# Patient Record
Sex: Female | Born: 1980 | Race: Black or African American | Hispanic: No | Marital: Married | Smoking: Current every day smoker
Health system: Southern US, Community
[De-identification: ages and names within clinical notes are randomized; demographics above are authoritative.]

## PROBLEM LIST (undated history)

## (undated) DIAGNOSIS — L409 Psoriasis, unspecified: Secondary | ICD-10-CM

## (undated) DIAGNOSIS — IMO0002 Reserved for concepts with insufficient information to code with codable children: Secondary | ICD-10-CM

## (undated) DIAGNOSIS — M329 Systemic lupus erythematosus, unspecified: Secondary | ICD-10-CM

## (undated) DIAGNOSIS — M1990 Unspecified osteoarthritis, unspecified site: Secondary | ICD-10-CM

---

## 2009-10-31 ENCOUNTER — Emergency Department (HOSPITAL_COMMUNITY): Admission: EM | Admit: 2009-10-31 | Discharge: 2009-10-31 | Payer: Self-pay | Admitting: Emergency Medicine

## 2011-10-01 ENCOUNTER — Encounter (HOSPITAL_COMMUNITY): Payer: Self-pay | Admitting: *Deleted

## 2011-10-01 ENCOUNTER — Emergency Department (HOSPITAL_COMMUNITY)
Admission: EM | Admit: 2011-10-01 | Discharge: 2011-10-01 | Disposition: A | Discharge status: Home / Self Care | Payer: Self-pay | Attending: Emergency Medicine | Admitting: Emergency Medicine

## 2011-10-01 DIAGNOSIS — R609 Edema, unspecified: Secondary | ICD-10-CM | POA: Insufficient documentation

## 2011-10-01 DIAGNOSIS — Z79899 Other long term (current) drug therapy: Secondary | ICD-10-CM | POA: Insufficient documentation

## 2011-10-01 DIAGNOSIS — M329 Systemic lupus erythematosus, unspecified: Secondary | ICD-10-CM | POA: Insufficient documentation

## 2011-10-01 DIAGNOSIS — M1990 Unspecified osteoarthritis, unspecified site: Secondary | ICD-10-CM

## 2011-10-01 DIAGNOSIS — M129 Arthropathy, unspecified: Secondary | ICD-10-CM | POA: Insufficient documentation

## 2011-10-01 DIAGNOSIS — M7989 Other specified soft tissue disorders: Secondary | ICD-10-CM | POA: Insufficient documentation

## 2011-10-01 HISTORY — DX: Systemic lupus erythematosus, unspecified: M32.9

## 2011-10-01 HISTORY — DX: Psoriasis, unspecified: L40.9

## 2011-10-01 HISTORY — DX: Unspecified osteoarthritis, unspecified site: M19.90

## 2011-10-01 HISTORY — DX: Reserved for concepts with insufficient information to code with codable children: IMO0002

## 2011-10-01 MED ORDER — OXYCODONE-ACETAMINOPHEN 5-325 MG PO TABS: 2.0000 | ORAL_TABLET | ORAL | Status: AC | PRN

## 2011-10-01 MED ORDER — IBUPROFEN 800 MG PO TABS
800.0000 mg | ORAL_TABLET | Freq: Once | ORAL | Status: AC
  Administered 2011-10-01: 800 mg via ORAL
  Filled 2011-10-01: qty 1

## 2011-10-01 NOTE — ED Provider Notes (Signed)
History     CSN: [id number]  Arrival date & time 10/01/11  1305   First MD Initiated Contact with Patient 10/01/11 1350      Chief Complaint  Patient presents with  . Extremity Pain    (Consider location/radiation/quality/duration/timing/severity/associated sxs/prior treatment) Patient is a 31 y.o. female presenting with extremity pain. The history is provided by the patient.  Extremity Pain Pertinent negatives include no headaches.   patient is a 31 year old, female, with lupus, who presents to emergency department complaining of arthralgias.  She denies cough, or shortness of breath, nausea, vomiting, fevers, chills, headaches.  She denies weakness.  She had no other symptoms.  She recently moved from [place], [place].  She does not have a local rheumatologist.  She is not taking any medication for her lupus at this time.  Past Medical History  Diagnosis Date  . Lupus   . rheumatoid   . Psoriasis     History reviewed. No pertinent past surgical history.  No family history on file.  History  Substance Use Topics  . Smoking status: Current Everyday Smoker -- 0.5 packs/day  . Smokeless tobacco: Not on file  . Alcohol Use: Yes     ocassionally    OB History    Grav Para Term Preterm Abortions TAB SAB Ect Mult Living                  Review of Systems  Constitutional: Negative for fever.  Gastrointestinal: Negative for nausea and vomiting.  Musculoskeletal: Positive for joint swelling.  Skin: Positive for rash.  Neurological: Negative for headaches.  Psychiatric/Behavioral: Negative for confusion.  All other systems reviewed and are negative.    Allergies  Review of patient's allergies indicates no known allergies.  Home Medications   Current Outpatient Rx  Name Route Sig Dispense Refill  . ASPIRIN EC 81 MG PO TBEC Oral Take 81 mg by mouth daily.    Marland Kitchen NAPROXEN SODIUM 220 MG PO TABS Oral Take 220 mg by mouth 2 (two) times daily with a meal.    .  OXYCODONE-ACETAMINOPHEN 5-325 MG PO TABS Oral Take 2 tablets by mouth every 4 (four) hours as needed for pain. 15 tablet 0    BP 145/113  Temp(Src) 98.5 F (36.9 C) (Oral)  Resp 16  Wt 145 lb (65.772 kg)  LMP 08/27/2011  Physical Exam  Vitals reviewed. Constitutional: She is oriented to person, place, and time. She appears well-developed and well-nourished.  HENT:  Head: Normocephalic and atraumatic.  Eyes: Pupils are equal, round, and reactive to light.  Neck: Normal range of motion.  Cardiovascular: Normal rate, regular rhythm and normal heart sounds.   No murmur heard. Pulmonary/Chest: Effort normal and breath sounds normal. No respiratory distress. She has no wheezes. She has no rales.  Musculoskeletal: Normal range of motion. She exhibits edema. She exhibits no tenderness.       Bilateral hand joint swelling, with tenderness.  Neurological: She is alert and oriented to person, place, and time. No cranial nerve deficit.  Skin: Skin is warm and dry. No rash noted. No erythema.  Psychiatric: She has a normal mood and affect. Her behavior is normal.    ED Course  Procedures (including critical care time) 31 year old, female, with lupus complains of arthritis, type symptoms.  No systemic symptoms.  There is no indication for radiographs or laboratory testing.  At this time.  Labs Reviewed - No data to display No results found.   1. Arthritis  2. Lupus       MDM  Arthritis, associated with lupus        Nicholes Stairs, MD 10/01/11 [id number]

## 2011-10-01 NOTE — ED Notes (Signed)
Pt states "was dx'd with lupus when I was 14, my ankles, knees, left wrist/hand hurt"

## 2011-10-01 NOTE — ED Notes (Signed)
Pt with L hand swelling from arthritis secondary to Lupus Dx. Pt states she started a new job 3 wks prior at [employer], and that since she has had foot and hand pain

## 2013-10-08 ENCOUNTER — Emergency Department (HOSPITAL_COMMUNITY)
Admission: EM | Admit: 2013-10-08 | Discharge: 2013-10-08 | Disposition: A | Discharge status: Home / Self Care | Payer: No Typology Code available for payment source | Attending: Emergency Medicine | Admitting: Emergency Medicine

## 2013-10-08 ENCOUNTER — Encounter (HOSPITAL_COMMUNITY): Payer: Self-pay | Admitting: Emergency Medicine

## 2013-10-08 DIAGNOSIS — K029 Dental caries, unspecified: Secondary | ICD-10-CM | POA: Insufficient documentation

## 2013-10-08 DIAGNOSIS — K047 Periapical abscess without sinus: Secondary | ICD-10-CM | POA: Insufficient documentation

## 2013-10-08 DIAGNOSIS — F17200 Nicotine dependence, unspecified, uncomplicated: Secondary | ICD-10-CM | POA: Insufficient documentation

## 2013-10-08 DIAGNOSIS — Z7982 Long term (current) use of aspirin: Secondary | ICD-10-CM | POA: Insufficient documentation

## 2013-10-08 DIAGNOSIS — L408 Other psoriasis: Secondary | ICD-10-CM | POA: Insufficient documentation

## 2013-10-08 DIAGNOSIS — K044 Acute apical periodontitis of pulpal origin: Secondary | ICD-10-CM | POA: Insufficient documentation

## 2013-10-08 DIAGNOSIS — Z791 Long term (current) use of non-steroidal anti-inflammatories (NSAID): Secondary | ICD-10-CM | POA: Insufficient documentation

## 2013-10-08 DIAGNOSIS — M129 Arthropathy, unspecified: Secondary | ICD-10-CM | POA: Insufficient documentation

## 2013-10-08 MED ORDER — OXYCODONE-ACETAMINOPHEN 5-325 MG PO TABS: 1.0000 | ORAL_TABLET | ORAL | Status: DC | PRN

## 2013-10-08 MED ORDER — AMOXICILLIN 500 MG PO CAPS
500.0000 mg | ORAL_CAPSULE | Freq: Three times a day (TID) | ORAL | Status: DC
Start: 2013-10-08 — End: 2013-10-08

## 2013-10-08 MED ORDER — ONDANSETRON 4 MG PO TBDP
4.0000 mg | ORAL_TABLET | Freq: Once | ORAL | Status: AC
  Administered 2013-10-08: 4 mg via ORAL
  Filled 2013-10-08: qty 1

## 2013-10-08 MED ORDER — OXYCODONE-ACETAMINOPHEN 5-325 MG PO TABS
2.0000 | ORAL_TABLET | Freq: Once | ORAL | Status: AC
  Administered 2013-10-08: 2 via ORAL
  Filled 2013-10-08: qty 2

## 2013-10-08 MED ORDER — AMOXICILLIN 500 MG PO CAPS
500.0000 mg | ORAL_CAPSULE | Freq: Three times a day (TID) | ORAL | Status: DC
Start: 2013-10-08 — End: 2017-02-03

## 2013-10-08 NOTE — Discharge Instructions (Signed)
You have been diagnosed with Dental pain. Please call the follow up dentist first thing in the morning on Monday for a follow up appointment. Keep your discharge paperwork from today's visit to bring to the dentist office. You may also use the resource guide listed below to help you find a dentist if you do not already have one to followup with. It is very important that you get evaluated by a dentist as soon as possible.  Use your pain medication as prescribed and do not operate heavy machinery while on pain medication. Note that your pain medication contains acetaminophen (Tylenol) & its is not reccommended that you use additional acetaminophen (Tylenol) while taking this medication. Take your full course of antibiotics. Read the instructions below. ° °Eat a soft or liquid diet and rinse your mouth out after meals with warm water. You should see a dentist or return here at once if you have increased swelling, increased pain or uncontrolled bleeding from the site of your injury. ° ° °SEEK MEDICAL CARE IF:  °· You have increased pain not controlled with medicines.  °· You have swelling around your tooth, in your face or neck.  °· You have bleeding which starts, continues, or gets worse.  °· You have a fever >101 °· If you are unable to open your mouth °Soft Diet  °The soft diet may be recommended after you were put on a full liquid diet. A normal diet may follow. The soft diet can also be used after surgery if you are too ill to keep down a normal diet. The soft diet may also be needed if you have a hard time chewing foods.  °DESCRIPTION  °Tender foods are used. Foods do not need to be ground or pureed. Most raw fruits and vegetables and coarse breads and cereals should be avoided. Fried foods and highly seasoned foods may cause discomfort.  °NUTRITIONAL ADEQUACY  °A healthy diet is possible if foods from each of the basic food groups are eaten daily.  °SOFT DIET FOOD LISTS  °Milk/Dairy  °Allowed: Milk and milk  drinks, milk shakes, cream cheese, cottage cheese, mild cheeses.  °Avoid: Sharp or highly seasoned cheese. °Meat/Meat Substitutes  °Allowed: Broiled, roasted, baked, or stewed tender lean beef, mutton, lamb, veal, chicken, turkey, liver, ham, crisp bacon, white fish, tuna, salmon. Eggs, smooth peanut butter.  °Avoid: All fried meats, fish, or fowl. Rich gravies and sauces. Lunch meats, sausages, hot dogs. Meats with gristle, chunky peanut butter. °Breads/Grains  °Allowed: Rice, noodles, spaghetti, macaroni. Dry or cooked refined cereals, such as farina, cream of wheat, oatmeal, grits, whole-wheat cereals. Plain or toasted white or wheat blend or whole-grain breads, soda crackers or saltines, flour tortillas.  °Avoid: Wild rice, coarse cereals, such as bran. Seed in or on breads and crackers. Bread or bread products with nuts or seeds. °Fruits/Vegetables  °Allowed: Fruit and vegetable juices, well-cooked or canned fruits and vegetables, any dried fruit. One citrus fruit daily, 1 vitamin A source daily. Well-ripened, easy to chew fruits, sweet potatoes. Baked, boiled, mashed, creamed, scalloped, or au gratin potatoes. Broths or creamed soups made with allowed vegetables, strained tomatoes.  °Avoid: All gas-forming vegetables (corn, radishes, Brussels sprouts, onions, broccoli, cabbage, parsnips, turnips, chili peppers, pinto beans, split peas, dried beans). Fruits containing seeds and skin. Potato chips and corn chips. All others that are not made with allowed vegetables. Highly seasoned soups. °Desserts/Sweets  °Allowed: Simple desserts, such as custard, junkets, gelatin desserts, plain ice cream and sherbets, simple cakes   and cookies, allowed fruits, sugar, syrup, jelly, honey, plain hard candy, and molasses.  °Avoid: Rich pastries, any dessert containing dates, nuts, raisins, or coconut. Fried pastries, such as doughnuts. Chocolate. °Beverages  °Allowed: Fruit and vegetable juices. Caffeine-free carbonated drinks,  coffee, and tea.  °Avoid: Caffeinated beverages: coffee, tea, soda or pop. °Miscellaneous  °Allowed: Butter, cream, margarine, mayonnaise, oil. Cream sauces, salt, and mild spices.  °Avoid: Highly spiced salad dressings. Highly seasoned foods, hot sauce, mustard, horseradish, and pepper. °SAMPLE MENU  °Breakfast  °Orange juice.  °Oatmeal.  °Soft cooked egg.  °Toast and margarine.  °2% milk.  °Coffee. °Lunch  °Meatloaf.  °Mashed potato.  °Green beans.  °Lemon pudding.  °Bread and margarine.  °Coffee. °Dinner  °Consommé or apricot nectar.  °Chicken breast.  °Rice, peas, and carrots.  °Applesauce.  °Bread and margarine.  °2% milk. °To cut the amount of fat in your diet, omit margarine and use 1% or skim milk.  °NUTRIENT ANALYSIS  °Calories........................1953 Kcal.  °Protein.........................102 gm.  °Carbohydrate...............247 gm.  °Fat................................65 gm.  °Cholesterol...................449 mg.  °Dietary fiber.................19 gm.  °Vitamin A.....................2944 RE.  °Vitamin C.....................79 mg.  °Niacin..........................25 mg.  °Riboflavin....................2.0 mg.  °Thiamin.......................1.5 mg.  °Folate..........................249 mcg.  °Calcium.......................1030 mg.  °Phosphorus.................1782 mg.  °Zinc..............................12 mg.  °Iron..............................13 mg.  °Sodium.........................299 mg.  °Potassium....................3046 mg. °Document Released: 11/26/2007 Document Revised: 11/11/2011 Document Reviewed: 11/26/2007  °ExitCare® Patient Information ©2014 [employer].  ° °RESOURCE GUIDE ° ° °Dental Problems ° °Dr. Janna Civilis °$200 dollar visit °[address] °[place], [place] [address]  °[phone] °  ° °Patients with Medicaid: °[facility]                     [facility] °[address]                                           [address] °Phone:   [phone]                                                  Phone:  [phone] ° °If unable to pay or uninsured, contact:  [facility] or [facility] to become qualified for the adult dental clinic. ° °Chronic Pain Problems °Contact [facility]  [phone] °Patients need to be referred by their primary care doctor. ° °Insufficient Money for Medicine °Contact [employer]:  call "211" or [facility] 271-5999. ° °No Primary Care Doctor °Call [facility]  832-8000 °Other agencies that provide inexpensive medical care °   [facility]  [phone] °    Internal Medicine  [phone] °   [facility]  [phone] °   Women[facility] Clinic  [phone] °   [facility]  [phone] °   [facility]  [phone] ° °Psychological Services °[facility]  [phone] °[facility]  [phone] °[facility]   [phone] (emergency services [phone]) ° °Substance Abuse Resources °[facility]  336-882-2125 °[facility] 336-784-9470 °[facility] 336-285-9073 °[facility] 336-845-3988 °Residential & Outpatient Substance Abuse Program  800-659-3381 ° °Abuse/Neglect °[place] [facility] (336) 641-3795 °[place] [facility] Hot[facility] 800-378-5315 (After Hours) ° °Emergency Shelter °[facility] (336) 271-5985 ° °Maternity Homes °[facility] (336) 275-9566 °[facility] (704) 372-4663 ° °MRSA Hotline #:   832-7006 ° ° ° °[place] [facility] ° °[facility]     [employer]                          [facility] °[address] [place]                       [address]      [address]  °[place]                                                [place]                            [place] °Phone:  [phone]                                   Phone:  [phone]                 Phone:   [phone] ° °[facility] °Phone:  342-8316 ° °[place] [facility] Hotline °(336) 342-1394 °(336) 342-3537 (After Hours) ° ° ° ° ° ° °

## 2013-10-08 NOTE — ED Provider Notes (Signed)
CSN: [id number]     Arrival date & time 10/08/13  1023 History   First MD Initiated Contact with Patient 10/08/13 1030     No chief complaint on file.  (Consider location/radiation/quality/duration/timing/severity/associated sxs/prior Treatment) HPI Comments: Patient here with complaint of dental infection.  She has lupus.  She is not on any suppressants at this time as she has had a lapse in her insurance.  Patient does not take steroids or methotrexate.  The patient states that when she has infection and it makes her lupus flare up which becomes extremely ill and in severe pain.  She is seeking treatment for her infection  Patient is a 33 y.o. female presenting with tooth pain. The history is provided by the patient and medical records. No language interpreter was used.  Dental Pain Location:  Lower Lower teeth location:  31/RL 2nd molar Quality:  Aching, dull and throbbing Severity:  Moderate Onset quality:  Gradual Duration:  2 days Timing:  Constant Progression:  Worsening Chronicity:  New Context: abscess, dental caries and poor dentition   Context: cap still on, not crown fracture, not dental fracture, not enamel fracture, filling still in place, not intrusion, not malocclusion, not recent dental surgery and not trauma   Relieved by:  Nothing Worsened by:  Pressure, jaw movement, hot food/drink, cold food/drink and touching Ineffective treatments:  NSAIDs, ice and topical anesthetic gel Associated symptoms: facial swelling and gum swelling   Associated symptoms: no congestion, no difficulty swallowing, no drooling, no facial pain, no fever, no headaches, no neck pain, no neck swelling, no oral bleeding, no oral lesions and no trismus   Risk factors: lack of dental care     Past Medical History  Diagnosis Date  . Lupus   . rheumatoid   . Psoriasis    No past surgical history on file. No family history on file. History  Substance Use Topics  . Smoking status: Current Every  Day Smoker -- 0.50 packs/day  . Smokeless tobacco: Not on file  . Alcohol Use: Yes     Comment: ocassionally   OB History   Grav Para Term Preterm Abortions TAB SAB Ect Mult Living                 Review of Systems  Constitutional: Negative for fever.  HENT: Positive for facial swelling. Negative for congestion, drooling and mouth sores.   Musculoskeletal: Negative for neck pain.  Neurological: Negative for headaches.    Allergies  Review of patient's allergies indicates no known allergies.  Home Medications   Current Outpatient Rx  Name  Route  Sig  Dispense  Refill  . aspirin EC 81 MG tablet   Oral   Take 81 mg by mouth daily.         . naproxen sodium (ANAPROX) 220 MG tablet   Oral   Take 220 mg by mouth 2 (two) times daily with a meal.          There were no vitals taken for this visit. Physical Exam  Constitutional: She is oriented to person, place, and time. She appears well-developed and well-nourished. No distress.  HENT:  Head: Normocephalic and atraumatic.  Mouth/Throat: Uvula is midline, oropharynx is clear and moist and mucous membranes are normal. No oral lesions. No trismus in the jaw. Dental abscesses and dental caries present. No uvula swelling or lacerations.    Right lower facial swelling.  Second molar is loose.  There is no palpable fluctuance in the  calm.  Suspect periapical abscess developing.  Eyes: Conjunctivae are normal. No scleral icterus.  Neck: Normal range of motion.  Cardiovascular: Normal rate, regular rhythm and normal heart sounds.  Exam reveals no gallop and no friction rub.   No murmur heard. Pulmonary/Chest: Effort normal and breath sounds normal. No respiratory distress.  Abdominal: Soft. Bowel sounds are normal. She exhibits no distension and no mass. There is no tenderness. There is no guarding.  Neurological: She is alert and oriented to person, place, and time.  Skin: Skin is warm and dry. Rash (psoriasis) noted. She is  not diaphoretic.    ED Course  Procedures (including critical care time) Labs Review Labs Reviewed - No data to display Imaging Review No results found.  EKG Interpretation   None       MDM   1. Dental infection    Patient with toothache.  No gross abscess.  Exam unconcerning for Ludwig's angina or spread of infection.  Will treat with penicillin and pain medicine.  Urged patient to follow-up with dentist.     Arthor Captain, PA-C 10/08/13 1050

## 2013-10-08 NOTE — ED Notes (Signed)
Pt c/o R side lower dental pain and possible infection.  Pain score 7/10.  Hx of Lupus (nonmedicated).

## 2013-10-08 NOTE — ED Provider Notes (Signed)
Medical screening examination/treatment/procedure(s) were performed by non-physician practitioner and as supervising physician I was immediately available for consultation/collaboration.     Geoffery Lyonsouglas Coralynn Gaona, MD 10/08/13 [phone]

## 2013-10-08 NOTE — ED Notes (Signed)
Pt escorted to discharge window. Verbalized understanding discharge instructions. In no acute distress.  Pt has been educated to not drive or operate heavy machinery while taking pain medication.  Pt was given dental resources and verbalized understanding.

## 2014-09-13 ENCOUNTER — Emergency Department (HOSPITAL_COMMUNITY): Payer: No Typology Code available for payment source

## 2014-09-13 ENCOUNTER — Emergency Department (HOSPITAL_COMMUNITY)
Admission: EM | Admit: 2014-09-13 | Discharge: 2014-09-13 | Disposition: A | Discharge status: Home / Self Care | Payer: No Typology Code available for payment source | Attending: Emergency Medicine | Admitting: Emergency Medicine

## 2014-09-13 ENCOUNTER — Encounter (HOSPITAL_COMMUNITY): Payer: Self-pay | Admitting: Emergency Medicine

## 2014-09-13 DIAGNOSIS — Y9289 Other specified places as the place of occurrence of the external cause: Secondary | ICD-10-CM | POA: Insufficient documentation

## 2014-09-13 DIAGNOSIS — S62502A Fracture of unspecified phalanx of left thumb, initial encounter for closed fracture: Secondary | ICD-10-CM

## 2014-09-13 DIAGNOSIS — Y998 Other external cause status: Secondary | ICD-10-CM | POA: Insufficient documentation

## 2014-09-13 DIAGNOSIS — Z792 Long term (current) use of antibiotics: Secondary | ICD-10-CM | POA: Insufficient documentation

## 2014-09-13 DIAGNOSIS — Y9389 Activity, other specified: Secondary | ICD-10-CM | POA: Insufficient documentation

## 2014-09-13 DIAGNOSIS — Z720 Tobacco use: Secondary | ICD-10-CM | POA: Insufficient documentation

## 2014-09-13 DIAGNOSIS — W228XXA Striking against or struck by other objects, initial encounter: Secondary | ICD-10-CM | POA: Insufficient documentation

## 2014-09-13 DIAGNOSIS — M79645 Pain in left finger(s): Secondary | ICD-10-CM

## 2014-09-13 DIAGNOSIS — S62515A Nondisplaced fracture of proximal phalanx of left thumb, initial encounter for closed fracture: Secondary | ICD-10-CM | POA: Insufficient documentation

## 2014-09-13 DIAGNOSIS — Z872 Personal history of diseases of the skin and subcutaneous tissue: Secondary | ICD-10-CM | POA: Insufficient documentation

## 2014-09-13 DIAGNOSIS — M1990 Unspecified osteoarthritis, unspecified site: Secondary | ICD-10-CM | POA: Insufficient documentation

## 2014-09-13 MED ORDER — OXYCODONE-ACETAMINOPHEN 5-325 MG PO TABS
2.0000 | ORAL_TABLET | Freq: Once | ORAL | Status: AC
  Administered 2014-09-13: 2 via ORAL
  Filled 2014-09-13: qty 2

## 2014-09-13 MED ORDER — OXYCODONE-ACETAMINOPHEN 5-325 MG PO TABS: 2.0000 | ORAL_TABLET | Freq: Four times a day (QID) | ORAL | Status: DC | PRN

## 2014-09-13 MED ORDER — PREDNISONE 20 MG PO TABS: 40.0000 mg | ORAL_TABLET | Freq: Every day | ORAL | Status: DC

## 2014-09-13 NOTE — Discharge Instructions (Signed)
Thumb Fracture  °There are many types of thumb fractures (breaks). There are different ways of treating these fractures, all of which may be correct, varying from case to case. Your caregiver will discuss different ways to treat these fractures with you. °TREATMENT  °· Immobilization. This means the fracture is casted as it is without changing the positions of the fracture (bone pieces) involved. This fracture is casted in a "thumb spica" also called a hitchhiker cast. It is generally left on for 2 to 6 weeks. °· Closed reduction. The bones are manipulated back into position without using surgery. °· ORIF (open reduction and internal fixation). The fracture site is opened and the bone pieces are fixed into place with some type of hardware such as screws or wires. °Your caregiver will discuss the type of fracture you have and the treatment that will be best for that problem. If surgery is the treatment of choice, the following is information for you to know and to let your caregiver know about prior to surgery. °LET YOUR CAREGIVERS KNOW ABOUT: °· Allergies. °· Medications taken including herbs, eye drops, over the counter medications, and creams. °· Use of steroids (by mouth or creams). °· Previous problems with anesthetics or Novocain. °· Family history of anesthetic complications.. °· Possibility of pregnancy, if this applies. °· History of blood clots (thrombophlebitis). °· History of bleeding or blood problems. °· Previous surgery. °· Other health problems. °AFTER THE PROCEDURE  °After surgery, you will be taken to the recovery area. A nurse will watch and check your progress. Once you are awake, stable, and taking fluids well, barring other problems you will be allowed to go home. Once home, an ice pack applied to your operative site may help with discomfort and keep the swelling down. Elevate your hand above your heart as much as possible for the first 4-5 days after the injury/surgery. °HOME CARE INSTRUCTIONS    °· Follow your caregiver's instructions as to activities, exercises, physical therapy, and driving a car. °· Use thumb and exercise as directed. °· Only take over-the-counter or prescription medicines for pain, discomfort, or fever as directed by your caregiver. Do not take aspirin until your caregiver instructs. This can increase bleeding immediately following surgery. °SEEK MEDICAL CARE IF:  °· There is increased bleeding (more than a small spot) from the wound or from beneath your cast or splint. °· There is redness, swelling, or increasing pain in the wound or from beneath your cast or splint. °· You have pus coming from wound or from beneath your cast or splint. °· An unexplained oral temperature above 102° F (38.9° C) develops. °· There is a foul smell coming from the wound or dressing or from beneath your cast or splint. °SEEK IMMEDIATE MEDICAL CARE IF:  °· You develop severe pain, decreased sensation such as numbness or tingling. °· You develop a rash. °· You have difficulty breathing. °· Youhave any allergic problems. °If you do not have a window in your cast for observing the wound, a discharge or minor bleeding may show up as a stain on the outside of your cast. Report these findings to your caregiver. If you have a removable splint overlying the surgical dressings it is common to see a small amount of bleeding. Change the dressings as instructed by your caregiver. °Document Released: 05/18/2003 Document Revised: 11/11/2011 Document Reviewed: 10/22/2013 °ExitCare® Patient Information ©2015 [employer]. This information is not intended to replace advice given to you by your health care provider. Make sure   you discuss any questions you have with your health care provider. ° °

## 2014-09-13 NOTE — ED Notes (Signed)
Ortho tech at bedside 

## 2014-09-13 NOTE — ED Notes (Signed)
Pt escorted to discharge window. Pt verbalized understanding discharge instructions. In no acute distress.  

## 2014-09-13 NOTE — ED Notes (Signed)
Pt states she has psoriais and has a patch on her left hand that is not getting better, she hit it yesterday and pain got worse.

## 2014-09-13 NOTE — ED Provider Notes (Signed)
CSN: [id number]     Arrival date & time 09/13/14  0751 History   First MD Initiated Contact with Patient 09/13/14 [phone]     Chief Complaint  Patient presents with  . Hand Pain     (Consider location/radiation/quality/duration/timing/severity/associated sxs/prior Treatment) HPI Comments: Patient with past medical history of lupus, rheumatoid arthritis, and psoriasis, who presents emergency department with chief complaint of left thumb pain. She states that she injured her left thumb when it was struck by a bolt of fabric yesterday. She complains of increased pain with movement. She also complains of psoriasis breaking out on the palmar aspect of her left hand and thumb. She states that this aggravates her symptoms. She states that she has tried using several different creams with no relief. She has taken prednisone in the past with good relief. She has not taken anything for the pain. Pain is worsened with movement and palpation.  The history is provided by the patient. No language interpreter was used.    Past Medical History  Diagnosis Date  . Lupus   . rheumatoid   . Psoriasis    History reviewed. No pertinent past surgical history. No family history on file. History  Substance Use Topics  . Smoking status: Current Every Day Smoker -- 0.50 packs/day    Types: Cigarettes  . Smokeless tobacco: Not on file  . Alcohol Use: Yes     Comment: ocassionally   OB History    No data available     Review of Systems  Constitutional: Negative for fever and chills.  Respiratory: Negative for shortness of breath.   Cardiovascular: Negative for chest pain.  Gastrointestinal: Negative for nausea, vomiting, diarrhea and constipation.  Genitourinary: Negative for dysuria.  Musculoskeletal: Positive for arthralgias.  Skin: Positive for rash.      Allergies  Review of patient's allergies indicates no known allergies.  Home Medications   Prior to Admission medications   Medication Sig  Start Date End Date Taking? Authorizing Provider  amoxicillin (AMOXIL) 500 MG capsule Take 1 capsule (500 mg total) by mouth 3 (three) times daily. 10/08/13   Arthor Captain, PA-C  naproxen sodium (ANAPROX) 220 MG tablet Take 220 mg by mouth 2 (two) times daily as needed (pain).     Historical Provider, MD  oxyCODONE-acetaminophen (PERCOCET) 5-325 MG per tablet Take 1-2 tablets by mouth every 4 (four) hours as needed. 10/08/13   Arthor Captain, PA-C   BP 133/97 mmHg  Pulse 89  Temp(Src) 98.7 F (37.1 C) (Oral)  Resp 16  SpO2 98%  LMP 08/29/2014 Physical Exam  Constitutional: She is oriented to person, place, and time. She appears well-developed and well-nourished.  HENT:  Head: Normocephalic and atraumatic.  Eyes: Conjunctivae and EOM are normal.  Neck: Normal range of motion.  Cardiovascular: Normal rate.   Intact distal pulses with brisk capillary refill  Pulmonary/Chest: Effort normal.  Abdominal: She exhibits no distension.  Musculoskeletal: Normal range of motion.  Left thumb range of motion strength limited secondary to pain, moderate swelling about the DIP and MCP, no obvious bony abnormality or deformity  Neurological: She is alert and oriented to person, place, and time.  Skin: Skin is dry.  Patchy psoriasis noted on face, neck, arm, and hand  Psychiatric: She has a normal mood and affect. Her behavior is normal. Judgment and thought content normal.  Nursing note and vitals reviewed.   ED Course  Procedures (including critical care time) Labs Review Labs Reviewed - No data to display  Imaging Review Dg Finger Thumb Left  09/13/2014   CLINICAL DATA:  The patient jammed her left thumb 09/12/2014. Continued pain. Initial encounter.  EXAM: LEFT THUMB 2+V  COMPARISON:  None.  FINDINGS: There is a nondisplaced fracture through the head head of the proximal phalanx of the thumb with associated soft tissue swelling. The fracture appears incomplete. No other bony or joint  abnormality is identified.  IMPRESSION: Nondisplaced fracture head of the proximal phalanx of the left thumb.   Electronically Signed   By: Drusilla Kannerhomas  Dalessio M.D.   On: 09/13/2014 08:53    Images reviewed in the PACS system by me personally, I agree with radiologist's impression.  SPLINT APPLICATION Date/Time: 9:33 AM Authorized by: Roxy HorsemanBROWNING, Timberly Yott Consent: Verbal consent obtained. Risks and benefits: risks, benefits and alternatives were discussed Consent given by: patient Splint applied by: orthopedic technician Location details: left thumb Splint type: thumb spica Supplies used: fiberglass and ace and padding Post-procedure: The splinted body part was neurovascularly unchanged following the procedure. Patient tolerance: Patient tolerated the procedure well with no immediate complications.      EKG Interpretation None      MDM   Final diagnoses:  Thumb pain, left    Patient with left thumb injury, will check plain films, and anticipate discharge. We'll likely give the patient a thumb splint, and also prescribe some prednisone for her psoriasis.  9:08 AM Plain film remarkable for thumb fracture at the DIP, will place patient in a thumb spica splint. Discharge home with Percocet and prednisone. Recommend hand follow-up. Patient understands and agrees with the plan. She is stable and ready for discharge.    Roxy Horsemanobert Antonieta Slaven, PA-C 09/13/14 [phone]  Gerhard Munchobert Lockwood, MD 09/13/14 [phone]

## 2017-02-03 ENCOUNTER — Emergency Department (HOSPITAL_COMMUNITY)
Admission: EM | Admit: 2017-02-03 | Discharge: 2017-02-03 | Disposition: A | Discharge status: Home / Self Care | Payer: PRIVATE HEALTH INSURANCE | Attending: Emergency Medicine | Admitting: Emergency Medicine

## 2017-02-03 ENCOUNTER — Emergency Department (HOSPITAL_COMMUNITY): Payer: PRIVATE HEALTH INSURANCE

## 2017-02-03 ENCOUNTER — Encounter (HOSPITAL_COMMUNITY): Payer: Self-pay

## 2017-02-03 DIAGNOSIS — R0981 Nasal congestion: Secondary | ICD-10-CM | POA: Diagnosis present

## 2017-02-03 DIAGNOSIS — F17210 Nicotine dependence, cigarettes, uncomplicated: Secondary | ICD-10-CM | POA: Insufficient documentation

## 2017-02-03 DIAGNOSIS — J209 Acute bronchitis, unspecified: Secondary | ICD-10-CM | POA: Diagnosis not present

## 2017-02-03 MED ORDER — PROMETHAZINE-DM 6.25-15 MG/5ML PO SYRP: 5.0000 mL | ORAL_SOLUTION | Freq: Four times a day (QID) | ORAL | 0 refills | Status: DC | PRN

## 2017-02-03 MED ORDER — AMOXICILLIN 875 MG PO TABS: 875.0000 mg | ORAL_TABLET | Freq: Two times a day (BID) | ORAL | 0 refills | Status: DC

## 2017-02-03 MED ORDER — IPRATROPIUM-ALBUTEROL 0.5-2.5 (3) MG/3ML IN SOLN
3.0000 mL | Freq: Once | RESPIRATORY_TRACT | Status: AC
  Administered 2017-02-03: 3 mL via RESPIRATORY_TRACT
  Filled 2017-02-03 (×2): qty 3

## 2017-02-03 NOTE — ED Provider Notes (Signed)
[facility]-EMERGENCY DEPT Provider Note   By signing my name below, I, Earmon PhoenixJennifer Waddell, attest that this documentation has been prepared under the direction and in the presence of Quynh Basso, PA-C. Electronically Signed: Earmon PhoenixJennifer Waddell, ED Scribe. 02/03/17. 9:34 PM.    History   Chief Complaint Chief Complaint  Patient presents with  . Nasal Congestion  . Cough   Misty James is a 36 y.o. female with PMHx of lupus, psoriasis and rheumatoid arthritis who presents to the Emergency Department complaining of cold-like symptoms that began about one week ago. She reports associated clear rhinorrhea, nasal congestion, productive cough of green mucus and chest tightness/heaviness with coughing. She reports fever two days ago Tmax 102 degrees, which has since resolved. She reports a mild sore throat attributed to coughing that is improving. She states she has noticed an increase in her respirations over the past two days. She has not taken anything for her symptoms. Coughing causes the chest discomfort. Lying down increases the cough. She denies alleviating factors. She denies HA, CP, SOB, leg swelling, current sore throat. She reports smoking about 0.5 PPD. She denies allergies to any medications. She does not currently have a PCP.  The history is provided by the patient and medical records. No language interpreter was used.   Past Medical History:  Diagnosis Date  . Lupus   . Psoriasis   . rheumatoid     There are no active problems to display for this patient.   History reviewed. No pertinent surgical history.  OB History    No data available       Home Medications    Prior to Admission medications   Medication Sig Start Date End Date Taking? Authorizing Provider  amoxicillin (AMOXIL) 875 MG tablet Take 1 tablet (875 mg total) by mouth 2 (two) times daily. 02/03/17   Jaydrian Corpening A, PA-C  naproxen sodium (ANAPROX) 220 MG tablet Take 220 mg by mouth 2 (two) times daily as  needed (pain).     [provider]  oxyCODONE-acetaminophen (PERCOCET/ROXICET) 5-325 MG per tablet Take 2 tablets by mouth every 6 (six) hours as needed for severe pain. 09/13/14   Roxy HorsemanBrowning, Robert, PA-C  predniSONE (DELTASONE) 20 MG tablet Take 2 tablets (40 mg total) by mouth daily. 09/13/14   Roxy HorsemanBrowning, Robert, PA-C  promethazine-dextromethorphan (PROMETHAZINE-DM) 6.25-15 MG/5ML syrup Take 5 mLs by mouth 4 (four) times daily as needed for cough. 02/03/17   Jayvan Mcshan, Coral ElseMia A, PA-C    Family History History reviewed. No pertinent family history.  Social History Social History  Substance Use Topics  . Smoking status: Current Every Day Smoker    Packs/day: 0.50    Types: Cigarettes  . Smokeless tobacco: Never Used  . Alcohol use Yes     Comment: ocassionally     Allergies   Patient has no known allergies.   Review of Systems Review of Systems  Constitutional: Positive for fever. Negative for activity change.  HENT: Positive for congestion and rhinorrhea. Negative for sore throat.   Respiratory: Positive for cough and chest tightness. Negative for shortness of breath.   Cardiovascular: Negative for chest pain and leg swelling.  Gastrointestinal: Negative for abdominal pain.  Musculoskeletal: Negative for back pain.  Skin: Negative for rash.  Neurological: Negative for headaches.  All other systems reviewed and are negative.  Physical Exam Updated Vital Signs BP (!) 168/92 (BP Location: Left Arm)   Pulse 87   Temp 98.3 F (36.8 C) (Oral)   Resp 16  LMP 01/31/2017   SpO2 100%   Physical Exam  Constitutional: No distress.  HENT:  Head: Normocephalic.  Right Ear: Tympanic membrane and ear canal normal.  Left Ear: Ear canal normal.  Nose: Rhinorrhea present.  Mouth/Throat: Uvula is midline and mucous membranes are normal. Posterior oropharyngeal erythema (minimal) present. No oropharyngeal exudate or posterior oropharyngeal edema.  Cerumen noted to bilateral ears.  Congestion noted in nose.  Eyes: Conjunctivae are normal.  Neck: Normal range of motion. Neck supple.  Cardiovascular: Normal rate, regular rhythm and normal heart sounds.  Exam reveals no gallop and no friction rub.   No murmur heard. Pulmonary/Chest: Effort normal and breath sounds normal. No respiratory distress. She has no wheezes. She has no rales.  Abdominal: Soft. She exhibits no distension.  Musculoskeletal: Normal range of motion.  Neurological: She is alert.  Skin: Skin is warm. Capillary refill takes less than 2 seconds. No rash noted.  Psychiatric: Her behavior is normal.  Nursing note and vitals reviewed.    ED Treatments / Results  DIAGNOSTIC STUDIES: Oxygen Saturation is 100% on RA, normal by my interpretation.   COORDINATION OF CARE: 8:26 PM- Will order nebulizer treatment. Will give resources to establish care with a PCP at discharge. Pt verbalizes understanding and agrees to plan.  9:10 PM- Pt reports significant relief of her symptoms after nebulizer treatment.   Medications  ipratropium-albuterol (DUONEB) 0.5-2.5 (3) MG/3ML nebulizer solution 3 mL (3 mLs Nebulization Given 02/03/17 2050)     Labs (all labs ordered are listed, but only abnormal results are displayed) Labs Reviewed - No data to display  EKG  EKG Interpretation None       Radiology Dg Chest 2 View  Result Date: 02/03/2017 CLINICAL DATA:  Congestion and cough x1 week EXAM: CHEST  2 VIEW COMPARISON:  None. FINDINGS: The heart size and mediastinal contours are within normal limits. Mild interstitial prominence may reflect acute bronchitic change. No pneumonic consolidation, effusion or pneumothorax. The visualized skeletal structures are unremarkable. IMPRESSION: Mild bilateral interstitial prominence may represent acute bronchitic change. Otherwise negative exam. Electronically Signed   By: Tollie Eth M.D.   On: 02/03/2017 18:44    Procedures Procedures (including critical care  time)  Medications Ordered in ED Medications  ipratropium-albuterol (DUONEB) 0.5-2.5 (3) MG/3ML nebulizer solution 3 mL (3 mLs Nebulization Given 02/03/17 2050)     Initial Impression / Assessment and Plan / ED Course  I have reviewed the triage vital signs and the nursing notes.  Pertinent labs & imaging results that were available during my care of the patient were reviewed by me and considered in my medical decision making (see chart for details).     Pt with h/o of Lupus symptoms consistent with bronchitis. CXR negative for acute infiltrate but does show mild bilateral interstitial prominence that may represent acute bronchitic changes. Unlikely lupus associated pleutitis, given no pleural effusion was seen on CXR. Low suspicion for acute lupus pneumonitis given symptomology. No pulmonary congestion noted on CT. Given high risk of developing associated bacterial infection and length of patient's symptoms, pt will be discharged with Amoxicillin.  Discussed return precautions.  Pt is hemodynamically stable & in NAD prior to discharge.   Final Clinical Impressions(s) / ED Diagnoses   Final diagnoses:  Acute bronchitis, unspecified organism    New Prescriptions Discharge Medication List as of 02/03/2017  9:40 PM    START taking these medications   Details  amoxicillin (AMOXIL) 875 MG tablet Take 1 tablet (875 mg  total) by mouth 2 (two) times daily., Starting Mon 02/03/2017, Print    promethazine-dextromethorphan (PROMETHAZINE-DM) 6.25-15 MG/5ML syrup Take 5 mLs by mouth 4 (four) times daily as needed for cough., Starting Mon 02/03/2017, Print        I personally performed the services described in this documentation, which was scribed in my presence. The recorded information has been reviewed and is accurate.     Frederik Pear A, PA-C 02/05/17 0413    Raeford Razor, MD 02/10/17 [phone]

## 2017-02-03 NOTE — ED Notes (Signed)
Duoneb finished. Pt sts she feels better.

## 2017-02-03 NOTE — Discharge Instructions (Signed)
Please call the number on her discharge paperwork to get established with a primary care provider. Please schedule a follow-up appointment in the next 3-5 days. Also, please have him recheck her blood pressure. Please take your antibiotic, amoxicillin, 2 times per day for the next 5 days. If he develops any new or worsening symptoms including fever, chills, difficulty breathing, or shortness of breath, please return to the emergency department for reevaluation.

## 2017-02-03 NOTE — ED Triage Notes (Signed)
Pt states hx of lupus. Has had cough and congestion X1 week. Pt states some chest discomfort with cough. Resp e/u. Skin warm and dry.

## 2018-11-30 ENCOUNTER — Emergency Department (HOSPITAL_COMMUNITY)
Admission: EM | Admit: 2018-11-30 | Discharge: 2018-11-30 | Disposition: A | Discharge status: Home / Self Care | Payer: PRIVATE HEALTH INSURANCE | Attending: Emergency Medicine | Admitting: Emergency Medicine

## 2018-11-30 ENCOUNTER — Encounter (HOSPITAL_COMMUNITY): Payer: Self-pay | Admitting: Emergency Medicine

## 2018-11-30 ENCOUNTER — Other Ambulatory Visit: Payer: Self-pay

## 2018-11-30 DIAGNOSIS — Y929 Unspecified place or not applicable: Secondary | ICD-10-CM | POA: Insufficient documentation

## 2018-11-30 DIAGNOSIS — Y939 Activity, unspecified: Secondary | ICD-10-CM | POA: Diagnosis not present

## 2018-11-30 DIAGNOSIS — Z79899 Other long term (current) drug therapy: Secondary | ICD-10-CM | POA: Diagnosis not present

## 2018-11-30 DIAGNOSIS — Y999 Unspecified external cause status: Secondary | ICD-10-CM | POA: Insufficient documentation

## 2018-11-30 DIAGNOSIS — X58XXXA Exposure to other specified factors, initial encounter: Secondary | ICD-10-CM | POA: Insufficient documentation

## 2018-11-30 DIAGNOSIS — S0502XA Injury of conjunctiva and corneal abrasion without foreign body, left eye, initial encounter: Secondary | ICD-10-CM | POA: Diagnosis not present

## 2018-11-30 DIAGNOSIS — H5712 Ocular pain, left eye: Secondary | ICD-10-CM | POA: Diagnosis present

## 2018-11-30 DIAGNOSIS — F17210 Nicotine dependence, cigarettes, uncomplicated: Secondary | ICD-10-CM | POA: Diagnosis not present

## 2018-11-30 MED ORDER — FLUORESCEIN SODIUM 1 MG OP STRP
1.0000 | ORAL_STRIP | Freq: Once | OPHTHALMIC | Status: AC
  Administered 2018-11-30: 1 via OPHTHALMIC
  Filled 2018-11-30: qty 1

## 2018-11-30 MED ORDER — LEVOFLOXACIN 0.5 % OP SOLN: 1.0000 [drp] | Freq: Four times a day (QID) | OPHTHALMIC | 0 refills | Status: AC

## 2018-11-30 MED ORDER — TETRACAINE HCL 0.5 % OP SOLN
2.0000 [drp] | Freq: Once | OPHTHALMIC | Status: AC
  Administered 2018-11-30: 2 [drp] via OPHTHALMIC
  Filled 2018-11-30: qty 4

## 2018-11-30 NOTE — Discharge Instructions (Addendum)
I have prescribed drops to help with your corneal abrasion. Please apply 1-2 drops every 2 hours while awake for 2 days THEN 4-8 hours for 3 days. Please follow up with opthalmology in 1 week, referral is attached to your paperwork.

## 2018-11-30 NOTE — ED Triage Notes (Signed)
Pt believes that she been sleeping in her contacts too long. Today having left eyelid swelling and some discomfort. Reports feels like something is in there.

## 2018-11-30 NOTE — ED Provider Notes (Signed)
[facility]-EMERGENCY DEPT Provider Note   CSN: [id number] Arrival date & time: 11/30/18  1529    History   Chief Complaint Chief Complaint  Patient presents with  . Eye Pain    HPI Misty James is a 38 y.o. female.     38 y.o female with a PMH of Lupus presents to the ED with a chief complaint of left eye pain. Patient reports she slept with her contacts longer than she usually does, she removed her contacts yesterday.  Reports she feels like there is a foreign sensation to her eye.  She has been wearing her regular lenses.She has not tried any therapy for relieve in symptoms. Patient reports she noted a "jelly" looking area on the outer portion of her eye this morning. She denies any blurry vision, decrease in vision or changes in vision.      Past Medical History:  Diagnosis Date  . Lupus (HCC)   . Psoriasis   . rheumatoid     There are no active problems to display for this patient.   History reviewed. No pertinent surgical history.   OB History   No obstetric history on file.      Home Medications    Prior to Admission medications   Medication Sig Start Date End Date Taking? Authorizing Provider  amoxicillin (AMOXIL) 875 MG tablet Take 1 tablet (875 mg total) by mouth 2 (two) times daily. 02/03/17   McDonald, Mia A, PA-C  levofloxacin Charlean Sanfilippo) 0.5 % ophthalmic solution Place 1 drop into the left eye every 6 (six) hours for 5 days. 11/30/18 12/05/18  Claude Manges, PA-C  naproxen sodium (ANAPROX) 220 MG tablet Take 220 mg by mouth 2 (two) times daily as needed (pain).     [provider]  oxyCODONE-acetaminophen (PERCOCET/ROXICET) 5-325 MG per tablet Take 2 tablets by mouth every 6 (six) hours as needed for severe pain. 09/13/14   Roxy Horseman, PA-C  predniSONE (DELTASONE) 20 MG tablet Take 2 tablets (40 mg total) by mouth daily. 09/13/14   Roxy Horseman, PA-C  promethazine-dextromethorphan (PROMETHAZINE-DM) 6.25-15 MG/5ML syrup  Take 5 mLs by mouth 4 (four) times daily as needed for cough. 02/03/17   McDonald, Mia A, PA-C    Family History No family history on file.  Social History Social History   Tobacco Use  . Smoking status: Current Every Day Smoker    Packs/day: 0.50    Types: Cigarettes  . Smokeless tobacco: Never Used  Substance Use Topics  . Alcohol use: Yes    Comment: ocassionally  . Drug use: No     Allergies   Patient has no known allergies.   Review of Systems Review of Systems  Constitutional: Negative for fever.  Eyes: Positive for pain, redness and itching. Negative for photophobia, discharge and visual disturbance.     Physical Exam Updated Vital Signs BP (!) 178/120 (BP Location: Right Arm) Comment: pt talking on cell phone  Pulse (!) 104 Comment: pt reports she is very anxious since cant have a visitor with her  Temp 99.8 F (37.7 C) (Oral)   Resp 18   LMP 11/16/2018   SpO2 100%   Physical Exam Vitals signs and nursing note reviewed.  Constitutional:      General: She is not in acute distress.    Appearance: She is well-developed.  HENT:     Head: Normocephalic and atraumatic.     Mouth/Throat:     Pharynx: No oropharyngeal exudate.  Eyes:  Conjunctiva/sclera:     Right eye: No chemosis.    Left eye: Left conjunctiva is injected. Chemosis present. No exudate or hemorrhage.    Pupils: Pupils are equal, round, and reactive to light.     Comments: Left eye looks injected, ecchymosis is present on the left outer region.  No discharge on exam.  Fluorescein uptake along sclera region, suspicion for corneal abrasion @ 6pm   Neck:     Musculoskeletal: Normal range of motion.  Cardiovascular:     Rate and Rhythm: Regular rhythm.     Heart sounds: Normal heart sounds.  Pulmonary:     Effort: Pulmonary effort is normal. No respiratory distress.     Breath sounds: Normal breath sounds.  Abdominal:     General: Bowel sounds are normal. There is no distension.      Palpations: Abdomen is soft.     Tenderness: There is no abdominal tenderness.  Musculoskeletal:        General: No tenderness or deformity.     Right lower leg: No edema.     Left lower leg: No edema.  Skin:    General: Skin is warm and dry.  Neurological:     Mental Status: She is alert and oriented to person, place, and time.      ED Treatments / Results  Labs (all labs ordered are listed, but only abnormal results are displayed) Labs Reviewed - No data to display  EKG None  Radiology No results found.  Procedures Procedures (including critical care time)  Medications Ordered in ED Medications  tetracaine (PONTOCAINE) 0.5 % ophthalmic solution 2 drop (2 drops Both Eyes Given by Other 11/30/18 1657)  fluorescein ophthalmic strip 1 strip (1 strip Both Eyes Given by Other 11/30/18 1656)     Initial Impression / Assessment and Plan / ED Course  I have reviewed the triage vital signs and the nursing notes.  Pertinent labs & imaging results that were available during my care of the patient were reviewed by me and considered in my medical decision making (see chart for details).       Patient presents with left eye pain which began after not removing her contact lenses.  Exam performed with floor seen along with tetracaine drops.  Noted ecchymosis to the left outer eye.  A visible corneal abrasion at 6:00.  Patient has been wearing her regular lenses at this time.  She will be prescribed levofloxacin to cover for Pseudomonas pathogen as she states leaving her contact lenses in for too long.  She is also encouraged to follow-up with ophthalmology.  No changes in vision, blurry vision, decrease in vision.  Patient understands and agrees with management.  Prescription for levofloxacin provided for patient.  Strict return precautions provided.  Patient requesting a note she currently works in a daycare, she has been advised this is not contagious, likely from her contact lens use.   Patient understands and agrees at this time.   Final Clinical Impressions(s) / ED Diagnoses   Final diagnoses:  Abrasion of left cornea, initial encounter    ED Discharge Orders         Ordered    levofloxacin Charlean Sanfilippo) 0.5 % ophthalmic solution  Every 6 hours     11/30/18 1710           Claude Manges, PA-C 11/30/18 1717    Benjiman Core, MD 11/30/18 2329

## 2018-12-03 NOTE — ED Provider Notes (Signed)
I was called by OP pharmacy.  It was prescribed levofloxacin eyedrops for corneal abrasion versus ulceration.  She is spent 2 days trying to find this medication and apparently there is only one pharmacy in the state that actually carries it.  I reviewed the chart and have changed the patient from levofloxacin eyedrops to erythromycin eye ointment half a strip in the lower lid 4 times daily for 5 days.   Arthor Captain, PA-C 12/03/18 1529    Virgina Norfolk, DO 12/03/18 1601

## 2019-02-25 ENCOUNTER — Ambulatory Visit (INDEPENDENT_AMBULATORY_CARE_PROVIDER_SITE_OTHER): Payer: PRIVATE HEALTH INSURANCE | Admitting: Registered Nurse

## 2019-02-25 ENCOUNTER — Encounter: Payer: Self-pay | Admitting: Registered Nurse

## 2019-02-25 ENCOUNTER — Other Ambulatory Visit: Payer: Self-pay

## 2019-02-25 VITALS — BP 150/111 | HR 122 | Temp 99.1°F | Resp 12 | Wt 162.6 lb

## 2019-02-25 DIAGNOSIS — Z13220 Encounter for screening for lipoid disorders: Secondary | ICD-10-CM | POA: Diagnosis not present

## 2019-02-25 DIAGNOSIS — Z1329 Encounter for screening for other suspected endocrine disorder: Secondary | ICD-10-CM

## 2019-02-25 DIAGNOSIS — L409 Psoriasis, unspecified: Secondary | ICD-10-CM | POA: Diagnosis not present

## 2019-02-25 DIAGNOSIS — Z13228 Encounter for screening for other metabolic disorders: Secondary | ICD-10-CM

## 2019-02-25 DIAGNOSIS — M329 Systemic lupus erythematosus, unspecified: Secondary | ICD-10-CM

## 2019-02-25 DIAGNOSIS — I10 Essential (primary) hypertension: Secondary | ICD-10-CM

## 2019-02-25 DIAGNOSIS — Z130 Encounter for screening for diseases of the blood and blood-forming organs and certain disorders involving the immune mechanism: Secondary | ICD-10-CM

## 2019-02-25 MED ORDER — CLOBETASOL PROPIONATE 0.05 % EX OINT
1.0000 | TOPICAL_OINTMENT | Freq: Two times a day (BID) | CUTANEOUS | 0 refills | Status: DC
Start: 2019-02-25 — End: 2019-06-09

## 2019-02-25 MED ORDER — CLOBETASOL PROPIONATE EMULSION 0.05 % EX FOAM
1.0000 | Freq: Two times a day (BID) | CUTANEOUS | 0 refills | Status: DC
Start: 2019-02-25 — End: 2019-06-09

## 2019-02-25 MED ORDER — LABETALOL HCL 100 MG PO TABS: 100.0000 mg | ORAL_TABLET | Freq: Two times a day (BID) | ORAL | 0 refills | Status: DC

## 2019-02-25 NOTE — Progress Notes (Signed)
Established Patient Office Visit  Subjective:  Patient ID: Misty James, female    DOB: Oct 27, 1980  Age: 38 y.o. MRN: [id number]  CC:  Chief Complaint  Patient presents with  . Establish Care    Patient is here today to establish care and to see what we can do for her skin rash. Pt has Lupus and has been on topical creams in the past that do not work. Patient bp was elevated in office today but states she gets very nervous. still high when rechecked. HBP run in family    HPI GUDELIA James presents for visit to establish care. She reports she has a hx of Lupus, RA, psoriasis, mixed connective tissue disorder.  She has not followed with rheumatology since she moved to [facility] at age 44. She reports her biggest concern is with her psoriasis, which has affected her scalp, hands, and feet, with some patches on her face. Otherwise, she reports that her symptoms are largely mild, but she has major concerns for underlying abnormalities and is nervous for lab results.  She is married x 13 years and is interested in having a child - her and her husband do not use protection. She has regular menses.  She reports that she had a pap 4 year ago - but unsure of result. We encouraged CPE at today's visit.  Past Medical History:  Diagnosis Date  . Lupus ([place])   . Psoriasis   . rheumatoid     No past surgical history on file.  No family history on file.  Social History   Socioeconomic History  . Marital status: Married    Spouse name: Not on file  . Number of children: Not on file  . Years of education: Not on file  . Highest education level: Not on file  Occupational History  . Not on file  Social Needs  . Financial resource strain: Not on file  . Food insecurity    Worry: Not on file    Inability: Not on file  . Transportation needs    Medical: Not on file    Non-medical: Not on file  Tobacco Use  . Smoking status: Current Every Day Smoker    Packs/day: 0.50   Types: Cigarettes  . Smokeless tobacco: Never Used  Substance and Sexual Activity  . Alcohol use: Yes    Comment: ocassionally  . Drug use: No  . Sexual activity: Not on file  Lifestyle  . Physical activity    Days per week: Not on file    Minutes per session: Not on file  . Stress: Not on file  Relationships  . Social Herbalist on phone: Not on file    Gets together: Not on file    Attends religious service: Not on file    Active member of club or organization: Not on file    Attends meetings of clubs or organizations: Not on file    Relationship status: Not on file  . Intimate partner violence    Fear of current or ex partner: Not on file    Emotionally abused: Not on file    Physically abused: Not on file    Forced sexual activity: Not on file  Other Topics Concern  . Not on file  Social History Narrative  . Not on file    Outpatient Medications Prior to Visit  Medication Sig Dispense Refill  . amoxicillin (AMOXIL) 875 MG tablet Take 1 tablet (875 mg total)  by mouth 2 (two) times daily. 10 tablet 0  . naproxen sodium (ANAPROX) 220 MG tablet Take 220 mg by mouth 2 (two) times daily as needed (pain).     Marland Kitchen oxyCODONE-acetaminophen (PERCOCET/ROXICET) 5-325 MG per tablet Take 2 tablets by mouth every 6 (six) hours as needed for severe pain. 15 tablet 0  . predniSONE (DELTASONE) 20 MG tablet Take 2 tablets (40 mg total) by mouth daily. 10 tablet 0  . promethazine-dextromethorphan (PROMETHAZINE-DM) 6.25-15 MG/5ML syrup Take 5 mLs by mouth 4 (four) times daily as needed for cough. 118 mL 0   No facility-administered medications prior to visit.     No Known Allergies  ROS Review of Systems  Constitutional: Negative.   HENT: Negative.   Eyes: Negative.   Respiratory: Negative.   Cardiovascular: Negative.   Gastrointestinal: Negative.   Endocrine: Negative.   Genitourinary: Negative.   Musculoskeletal: Positive for arthralgias.       RA  Skin: Positive for  rash and wound.       Psoriasis on hands, feet, scalp  Allergic/Immunologic: Negative.   Neurological: Negative.   Hematological: Negative.   Psychiatric/Behavioral: Negative.       Objective:    Physical Exam  Constitutional: She is oriented to person, place, and time. She appears well-developed and well-nourished.  HENT:  Head: Normocephalic and atraumatic.  Cardiovascular: Normal rate and regular rhythm.  Pulmonary/Chest: Effort normal. No respiratory distress.  Neurological: She is alert and oriented to person, place, and time.  Skin: Skin is warm and dry. Rash noted. She is not diaphoretic.  Psoriasis covering 60% of both hands, with major lesions affecting folds on inside of thumb joints. Has one large lesion on posterior of L hand that seems to be healing well.  Has 4" circular patch on scalp - likely psoriaform - does not appear to be alopecia areata.  No signs of vitiligo.   Psychiatric: Her speech is normal and behavior is normal. Judgment and thought content normal. Her mood appears anxious. Her affect is not inappropriate. Cognition and memory are normal.  Pt very nervous about receiving care - embarrassed that it has been so long.   Nursing note and vitals reviewed.   BP (!) 150/111 (BP Location: Right Arm, Patient Position: Sitting, Cuff Size: Normal)   Pulse (!) 122   Temp 99.1 F (37.3 C) (Oral)   Resp 12   Wt 162 lb 9.6 oz (73.8 kg)   SpO2 100%  Wt Readings from Last 3 Encounters:  02/25/19 162 lb 9.6 oz (73.8 kg)  10/01/11 145 lb (65.8 kg)     There are no preventive care reminders to display for this patient.  There are no preventive care reminders to display for this patient.  No results found for: TSH No results found for: WBC, HGB, HCT, MCV, PLT No results found for: NA, K, CHLORIDE, CO2, GLUCOSE, BUN, CREATININE, BILITOT, ALKPHOS, AST, ALT, PROT, ALBUMIN, CALCIUM, ANIONGAP, EGFR, GFR No results found for: CHOL No results found for: HDL No  results found for: LDLCALC No results found for: TRIG No results found for: CHOLHDL No results found for: HGBA1C    Assessment & Plan:   Problem List Items Addressed This Visit    None    Visit Diagnoses    Psoriasis    -  Primary   Relevant Orders   Ambulatory referral to Dermatology   Lupus [facility])       Relevant Orders   Ambulatory referral to Rheumatology  ANA,IFA RA Diag Pnl w/rflx Tit/Patn   Screening for endocrine, metabolic and immunity disorder       Relevant Orders   CBC with Differential/Platelet   Comprehensive metabolic panel   Hemoglobin A1c   TSH   Lipid screening       Relevant Orders   Lipid panel      No orders of the defined types were placed in this encounter.   Follow-up: No follow-ups on file.   PLAN:  Referrals to Derm and Rheum placed  Routine labs and Lupus labs drawn - will follow up as warranted.  Pt given script for Labetalol '100mg'$  PO bid - instructed NOT to start until lab work returns.  Pt given scripts for Clobetasol 0.5% ointment and foam - foam tends to be expensive, as such, she can choose to use either depending on the cost. Apply twice daily to affected areas. We reviewed to NEVER apply this to face or genitalia.  She is very anxious about receiving care. As such, we reviewed the most pressing issues today, otherwise, we will see her in around 3 months for follow up.  Patient encouraged to call clinic with any questions, comments, or concerns.     Maximiano Coss, NP

## 2019-02-25 NOTE — Patient Instructions (Signed)
° ° ° °  If you have lab work done today you will be contacted with your lab results within the next 2 weeks.  If you have not heard from us then please contact us. The fastest way to get your results is to register for My Chart. ° ° °IF you received an x-ray today, you will receive an invoice from [facility]. Please contact [facility] at [phone] with questions or concerns regarding your invoice.  ° °IF you received labwork today, you will receive an invoice from [facility]. Please contact [facility] at [phone] with questions or concerns regarding your invoice.  ° °Our billing staff will not be able to assist you with questions regarding bills from these companies. ° °You will be contacted with the lab results as soon as they are available. The fastest way to get your results is to activate your My Chart account. Instructions are located on the last page of this paperwork. If you have not heard from us regarding the results in 2 weeks, please contact this office. °  ° ° ° °

## 2019-02-27 LAB — COMPREHENSIVE METABOLIC PANEL
ALT: 35 IU/L — ABNORMAL HIGH (ref 0–32)
AST: 68 IU/L — ABNORMAL HIGH (ref 0–40)
Albumin/Globulin Ratio: 1.2 (ref 1.2–2.2)
Albumin: 4.6 g/dL (ref 3.8–4.8)
Alkaline Phosphatase: 175 IU/L — ABNORMAL HIGH (ref 39–117)
BUN/Creatinine Ratio: 10 (ref 9–23)
BUN: 6 mg/dL (ref 6–20)
Bilirubin Total: 0.6 mg/dL (ref 0.0–1.2)
CO2: 19 mmol/L — ABNORMAL LOW (ref 20–29)
Calcium: 9 mg/dL (ref 8.7–10.2)
Chloride: 102 mmol/L (ref 96–106)
Creatinine, Ser: 0.63 mg/dL (ref 0.57–1.00)
GFR calc Af Amer: 134 mL/min/{1.73_m2} (ref >59)
GFR calc non Af Amer: 117 mL/min/{1.73_m2} (ref >59)
Globulin, Total: 4 g/dL (ref 1.5–4.5)
Glucose: 89 mg/dL (ref 65–99)
Potassium: 3.2 mmol/L — ABNORMAL LOW (ref 3.5–5.2)
Sodium: 139 mmol/L (ref 134–144)
Total Protein: 8.6 g/dL — ABNORMAL HIGH (ref 6.0–8.5)

## 2019-02-27 LAB — CBC WITH DIFFERENTIAL/PLATELET
Basophils Absolute: 0 10*3/uL (ref 0.0–0.2)
Basos: 0 %
EOS (ABSOLUTE): 0 10*3/uL (ref 0.0–0.4)
Eos: 1 %
Hematocrit: 42.7 % (ref 34.0–46.6)
Hemoglobin: 14.3 g/dL (ref 11.1–15.9)
Immature Grans (Abs): 0 10*3/uL (ref 0.0–0.1)
Immature Granulocytes: 0 %
Lymphocytes Absolute: 0.6 10*3/uL — ABNORMAL LOW (ref 0.7–3.1)
Lymphs: 23 %
MCH: 32.1 pg (ref 26.6–33.0)
MCHC: 33.5 g/dL (ref 31.5–35.7)
MCV: 96 fL (ref 79–97)
Monocytes Absolute: 0.4 10*3/uL (ref 0.1–0.9)
Monocytes: 15 %
Neutrophils Absolute: 1.7 10*3/uL (ref 1.4–7.0)
Neutrophils: 61 %
Platelets: 128 10*3/uL — ABNORMAL LOW (ref 150–450)
RBC: 4.46 x10E6/uL (ref 3.77–5.28)
RDW: 12.3 % (ref 11.7–15.4)
WBC: 2.8 10*3/uL — ABNORMAL LOW (ref 3.4–10.8)

## 2019-02-27 LAB — LIPID PANEL
Chol/HDL Ratio: 2.9 ratio (ref 0.0–4.4)
Cholesterol, Total: 152 mg/dL (ref 100–199)
HDL: 52 mg/dL (ref >39)
LDL Calculated: 75 mg/dL (ref 0–99)
Triglycerides: 124 mg/dL (ref 0–149)
VLDL Cholesterol Cal: 25 mg/dL (ref 5–40)

## 2019-02-27 LAB — ANA,IFA RA DIAG PNL W/RFLX TIT/PATN
ANA Titer 1: NEGATIVE
Cyclic Citrullin Peptide Ab: 5 units (ref 0–19)
Rhuematoid fact SerPl-aCnc: <10 IU/mL (ref 0.0–13.9)

## 2019-02-27 LAB — HEMOGLOBIN A1C
Est. average glucose Bld gHb Est-mCnc: 108 mg/dL
Hgb A1c MFr Bld: 5.4 % (ref 4.8–5.6)

## 2019-02-27 LAB — TSH: TSH: 1.78 u[IU]/mL (ref 0.450–4.500)

## 2019-03-01 NOTE — Progress Notes (Signed)
Good morning Misty James -  Your lab results are back, and they're looking overall pretty good!  There are abnormalities in your CBC and CMP that I hoped to write to you about.  Your WBC (white blood cell) count, Platelets, and Lymphocytes (absolute) are diminished. This is commonly related to having a chronically inflammatory process. Your potassium is on the low side - please incorporate some potassium rich foods into your diet. Leafy greens and fruits are good sources of potassium. Your Total Protein, AlkPhos, AST, and ALT are elevated. Often, patients with autoimmune disorders have subclinical liver disease - this means that there are not really urgent implications, but it can affect what treatments we give you going forward.   Your ANA, RF, and CCP came back negative - this is not always common in a person with lupus, and given your psoriasis, I want you to still go ahead and see dermatology and rheumatology for management. They will almost certainly want to run more lab tests for follow up.  I want to thank you again for coming in last week. Starting healthcare with a new provider isn't easy, but I'm looking forward to working with you. Please let me know if there's anything I can do for you.  Kathrin Ruddy, NP

## 2019-03-03 ENCOUNTER — Encounter: Payer: Self-pay | Admitting: Registered Nurse

## 2019-03-05 ENCOUNTER — Ambulatory Visit: Payer: PRIVATE HEALTH INSURANCE | Admitting: Registered Nurse

## 2019-04-01 ENCOUNTER — Encounter: Payer: Self-pay | Admitting: Registered Nurse

## 2019-04-02 ENCOUNTER — Other Ambulatory Visit: Payer: Self-pay | Admitting: Registered Nurse

## 2019-04-02 ENCOUNTER — Encounter: Payer: Self-pay | Admitting: Registered Nurse

## 2019-04-02 DIAGNOSIS — L409 Psoriasis, unspecified: Secondary | ICD-10-CM

## 2019-04-02 DIAGNOSIS — M329 Systemic lupus erythematosus, unspecified: Secondary | ICD-10-CM

## 2019-04-02 NOTE — Progress Notes (Signed)
Resending referral to derm - Unfortunately clobetasol not effective for this patient at this time.  Kathrin Ruddy, NP

## 2019-05-07 ENCOUNTER — Ambulatory Visit: Payer: PRIVATE HEALTH INSURANCE | Admitting: Registered Nurse

## 2019-05-24 ENCOUNTER — Ambulatory Visit: Payer: PRIVATE HEALTH INSURANCE | Admitting: Registered Nurse

## 2019-05-25 ENCOUNTER — Encounter: Payer: Self-pay | Admitting: Registered Nurse

## 2019-06-08 ENCOUNTER — Encounter: Payer: Self-pay | Admitting: Registered Nurse

## 2019-06-09 ENCOUNTER — Encounter: Payer: Self-pay | Admitting: Registered Nurse

## 2019-06-09 ENCOUNTER — Other Ambulatory Visit: Payer: Self-pay

## 2019-06-09 ENCOUNTER — Ambulatory Visit (INDEPENDENT_AMBULATORY_CARE_PROVIDER_SITE_OTHER): Payer: PRIVATE HEALTH INSURANCE | Admitting: Registered Nurse

## 2019-06-09 VITALS — BP 160/90 | HR 89 | Temp 99.0°F | Resp 16 | Ht 62.0 in | Wt 155.0 lb

## 2019-06-09 DIAGNOSIS — I10 Essential (primary) hypertension: Secondary | ICD-10-CM | POA: Diagnosis not present

## 2019-06-09 DIAGNOSIS — F411 Generalized anxiety disorder: Secondary | ICD-10-CM

## 2019-06-09 DIAGNOSIS — J45909 Unspecified asthma, uncomplicated: Secondary | ICD-10-CM | POA: Diagnosis not present

## 2019-06-09 DIAGNOSIS — M329 Systemic lupus erythematosus, unspecified: Secondary | ICD-10-CM

## 2019-06-09 DIAGNOSIS — L409 Psoriasis, unspecified: Secondary | ICD-10-CM | POA: Diagnosis not present

## 2019-06-09 MED ORDER — ALBUTEROL SULFATE HFA 108 (90 BASE) MCG/ACT IN AERS: 2.0000 | INHALATION_SPRAY | Freq: Four times a day (QID) | RESPIRATORY_TRACT | 2 refills | Status: DC | PRN

## 2019-06-09 MED ORDER — DEXTROMETHORPHAN HBR 15 MG/5ML PO SYRP: 10.0000 mL | ORAL_SOLUTION | Freq: Four times a day (QID) | ORAL | 0 refills | Status: DC | PRN

## 2019-06-09 MED ORDER — AMLODIPINE BESYLATE 10 MG PO TABS: 10.0000 mg | ORAL_TABLET | Freq: Every day | ORAL | 3 refills | Status: DC

## 2019-06-09 MED ORDER — CLOBETASOL PROPIONATE 0.05 % EX OINT: 1.0000 "application " | TOPICAL_OINTMENT | Freq: Two times a day (BID) | CUTANEOUS | 0 refills | Status: DC

## 2019-06-09 MED ORDER — CETIRIZINE HCL 10 MG PO TABS: 10.0000 mg | ORAL_TABLET | Freq: Every day | ORAL | 11 refills | Status: DC

## 2019-06-09 MED ORDER — BUSPIRONE HCL 5 MG PO TABS: 5.0000 mg | ORAL_TABLET | Freq: Three times a day (TID) | ORAL | 0 refills | Status: DC

## 2019-06-09 NOTE — Progress Notes (Signed)
**Note Misty-Identified via Obfuscation** Established Patient Office Visit  Subjective:  Patient ID: Misty James, female    DOB: 1980/10/03  Age: 38 y.o. MRN: [id number]  CC:  Chief Complaint  Patient presents with  . Hypertension    pt states she needed to follow-up with her B/P. She states it is still elavated   . Cough    with some chest congestion but has no fever or nasal drainage    HPI Misty James presents for 3 mo check on chronic conditions  She unfortunately was not able to see either rheumatology or dermatology due to insurance issues.  She did not start the labetalol. She states the clobetasol ointment has been extremely helpful.  She notes some irritation in her upper chest causing a dry cough. No other COVID symptoms, including change in senses, fever, fatigue, headache. No known sick contacts, no myalgias, no malaise. Has been out of work the past two days as a precaution. Notes it is worst at night, she has been sleeping with the windows open.  Otherwise, feels good. Psoriasis has improved. BP still elevated today.   Past Medical History:  Diagnosis Date  . Lupus ([place])   . Psoriasis   . rheumatoid     History reviewed. No pertinent surgical history.  History reviewed. No pertinent family history.  Social History   Socioeconomic History  . Marital status: Married    Spouse name: Not on file  . Number of children: Not on file  . Years of education: Not on file  . Highest education level: Not on file  Occupational History  . Not on file  Social Needs  . Financial resource strain: Not on file  . Food insecurity    Worry: Not on file    Inability: Not on file  . Transportation needs    Medical: Not on file    Non-medical: Not on file  Tobacco Use  . Smoking status: Current Every Day Smoker    Packs/day: 0.50    Types: Cigarettes  . Smokeless tobacco: Never Used  Substance and Sexual Activity  . Alcohol use: Yes    Comment: ocassionally  . Drug use: No  . Sexual activity:  Not on file  Lifestyle  . Physical activity    Days per week: Not on file    Minutes per session: Not on file  . Stress: Not on file  Relationships  . Social Herbalist on phone: Not on file    Gets together: Not on file    Attends religious service: Not on file    Active member of club or organization: Not on file    Attends meetings of clubs or organizations: Not on file    Relationship status: Not on file  . Intimate partner violence    Fear of current or ex partner: Not on file    Emotionally abused: Not on file    Physically abused: Not on file    Forced sexual activity: Not on file  Other Topics Concern  . Not on file  Social History Narrative  . Not on file    Outpatient Medications Prior to Visit  Medication Sig Dispense Refill  . clobetasol ointment (TEMOVATE) 9.93 % Apply 1 application topically 2 (two) times daily. 30 g 0  . Clobetasol Propionate Emulsion 0.05 % topical foam Apply 1 application topically 2 (two) times daily. 100 g 0  . labetalol (NORMODYNE) 100 MG tablet Take 1 tablet (100 mg total) by mouth  2 (two) times daily. 180 tablet 0   No facility-administered medications prior to visit.     No Known Allergies  ROS Review of Systems  Constitutional: Negative.   HENT: Negative.   Eyes: Negative.   Respiratory: Positive for cough. Negative for apnea, choking, chest tightness, shortness of breath, wheezing and stridor.   Cardiovascular: Negative.   Gastrointestinal: Negative.   Endocrine: Negative.   Genitourinary: Negative.   Musculoskeletal: Negative.   Skin: Positive for rash (psoriasis, improving).  Allergic/Immunologic: Negative.   Neurological: Negative.   Hematological: Negative.   Psychiatric/Behavioral: Negative.   All other systems reviewed and are negative.     Objective:    Physical Exam  Constitutional: She is oriented to person, place, and time. She appears well-developed and well-nourished. No distress.   Cardiovascular: Normal rate and regular rhythm.  Pulmonary/Chest: Effort normal. No respiratory distress.  Neurological: She is alert and oriented to person, place, and time.  Skin: Skin is warm and dry. Rash (notable improvement in psoriasis on hands, palms, and scalp) noted. She is not diaphoretic. No erythema. No pallor.  Psychiatric: She has a normal mood and affect. Her behavior is normal. Judgment and thought content normal.  Nursing note and vitals reviewed.   BP (!) 160/90   Pulse 89   Temp 99 F (37.2 C) (Oral)   Resp 16   Ht _0  (1.575 m)   Wt 155 lb (70.3 kg)   LMP  (LMP Unknown)   SpO2 97%   BMI 28.35 kg/m  Wt Readings from Last 3 Encounters:  06/09/19 155 lb (70.3 kg)  02/25/19 162 lb 9.6 oz (73.8 kg)  10/01/11 145 lb (65.8 kg)     Health Maintenance Due  Topic Date Due  . TETANUS/TDAP  06/06/2002  . PAP SMEAR-Modifier  06/06/2004    There are no preventive care reminders to display for this patient.  Lab Results  Component Value Date   TSH 1.780 02/25/2019   Lab Results  Component Value Date   WBC 2.8 (L) 02/25/2019   HGB 14.3 02/25/2019   HCT 42.7 02/25/2019   MCV 96 02/25/2019   PLT 128 (L) 02/25/2019   Lab Results  Component Value Date   NA 139 02/25/2019   K 3.2 (L) 02/25/2019   CO2 19 (L) 02/25/2019   GLUCOSE 89 02/25/2019   BUN 6 02/25/2019   CREATININE 0.63 02/25/2019   BILITOT 0.6 02/25/2019   ALKPHOS 175 (H) 02/25/2019   AST 68 (H) 02/25/2019   ALT 35 (H) 02/25/2019   PROT 8.6 (H) 02/25/2019   ALBUMIN 4.6 02/25/2019   CALCIUM 9.0 02/25/2019   Lab Results  Component Value Date   CHOL 152 02/25/2019   Lab Results  Component Value Date   HDL 52 02/25/2019   Lab Results  Component Value Date   LDLCALC 75 02/25/2019   Lab Results  Component Value Date   TRIG 124 02/25/2019   Lab Results  Component Value Date   CHOLHDL 2.9 02/25/2019   Lab Results  Component Value Date   HGBA1C 5.4 02/25/2019      Assessment &  Plan:   Problem List Items Addressed This Visit    None    Visit Diagnoses    Lupus ([place])    -  Primary   Relevant Orders   CBC with Differential   CMP14+EGFR   Ambulatory referral to Rheumatology   Bronchitis, allergic, unspecified asthma severity, uncomplicated       Relevant Medications  albuterol (VENTOLIN HFA) 108 (90 Base) MCG/ACT inhaler   dextromethorphan 15 MG/5ML syrup   cetirizine (ZYRTEC) 10 MG tablet   Psoriasis       Relevant Medications   clobetasol ointment (TEMOVATE) 0.05 %   Other Relevant Orders   Ambulatory referral to Dermatology   Essential hypertension       Relevant Medications   amLODipine (NORVASC) 10 MG tablet      Meds ordered this encounter  Medications  . albuterol (VENTOLIN HFA) 108 (90 Base) MCG/ACT inhaler    Sig: Inhale 2 puffs into the lungs every 6 (six) hours as needed for wheezing or shortness of breath.    Dispense:  8 g    Refill:  2    Order Specific Question:   Supervising Provider    Answer:   Delia Chimes A [id number]  . dextromethorphan 15 MG/5ML syrup    Sig: Take 10 mLs (30 mg total) by mouth 4 (four) times daily as needed for cough.    Dispense:  120 mL    Refill:  0    Order Specific Question:   Supervising Provider    Answer:   Delia Chimes A [id number]  . cetirizine (ZYRTEC) 10 MG tablet    Sig: Take 1 tablet (10 mg total) by mouth daily.    Dispense:  30 tablet    Refill:  11    Order Specific Question:   Supervising Provider    Answer:   Delia Chimes A [id number]  . clobetasol ointment (TEMOVATE) 0.05 %    Sig: Apply 1 application topically 2 (two) times daily.    Dispense:  30 g    Refill:  0    Order Specific Question:   Supervising Provider    Answer:   Delia Chimes A [id number]  . amLODipine (NORVASC) 10 MG tablet    Sig: Take 1 tablet (10 mg total) by mouth daily.    Dispense:  90 tablet    Refill:  3    Order Specific Question:   Supervising Provider    Answer:   Forrest Moron [id number]     Follow-up: Return in about 2 weeks (around 06/23/2019) for Nurse visit BP check.   PLAN  Will refill Clobetasol ointment  Resending referrals to derm and rheum - she is hoping to change her insurance during this year's open enrollment period  Dry cough likely allergic bronchitis or viral bronchitis - will treat symptomatically with albuterol inhaler, dextromethorphan, and cetirizine.   Start amlodipine 82m PO qd for HTN. Recheck BP in around 2 weeks with nurse visit BP check   CBC and CMP drawn, will follow up with results as warranted.  Patient encouraged to call clinic with any questions, comments, or concerns.     RMaximiano Coss NP

## 2019-06-09 NOTE — Patient Instructions (Signed)
° ° ° °  If you have lab work done today you will be contacted with your lab results within the next 2 weeks.  If you have not heard from us then please contact us. The fastest way to get your results is to register for My Chart. ° ° °IF you received an x-ray today, you will receive an invoice from [facility]. Please contact [facility] at [phone] with questions or concerns regarding your invoice.  ° °IF you received labwork today, you will receive an invoice from [facility]. Please contact [facility] at [phone] with questions or concerns regarding your invoice.  ° °Our billing staff will not be able to assist you with questions regarding bills from these companies. ° °You will be contacted with the lab results as soon as they are available. The fastest way to get your results is to activate your My Chart account. Instructions are located on the last page of this paperwork. If you have not heard from us regarding the results in 2 weeks, please contact this office. °  ° ° ° °

## 2019-06-10 ENCOUNTER — Telehealth: Payer: Self-pay | Admitting: Registered Nurse

## 2019-06-10 LAB — CMP14+EGFR
ALT: 73 IU/L — ABNORMAL HIGH (ref 0–32)
AST: 127 IU/L — ABNORMAL HIGH (ref 0–40)
Albumin/Globulin Ratio: 1.1 — ABNORMAL LOW (ref 1.2–2.2)
Albumin: 4.3 g/dL (ref 3.8–4.8)
Alkaline Phosphatase: 163 IU/L — ABNORMAL HIGH (ref 39–117)
BUN/Creatinine Ratio: 8 — ABNORMAL LOW (ref 9–23)
BUN: 5 mg/dL — ABNORMAL LOW (ref 6–20)
Bilirubin Total: 1.2 mg/dL (ref 0.0–1.2)
CO2: 24 mmol/L (ref 20–29)
Calcium: 9 mg/dL (ref 8.7–10.2)
Chloride: 98 mmol/L (ref 96–106)
Creatinine, Ser: 0.63 mg/dL (ref 0.57–1.00)
GFR calc Af Amer: 133 mL/min/{1.73_m2} (ref >59)
GFR calc non Af Amer: 116 mL/min/{1.73_m2} (ref >59)
Globulin, Total: 3.9 g/dL (ref 1.5–4.5)
Glucose: 106 mg/dL — ABNORMAL HIGH (ref 65–99)
Potassium: 3.3 mmol/L — ABNORMAL LOW (ref 3.5–5.2)
Sodium: 139 mmol/L (ref 134–144)
Total Protein: 8.2 g/dL (ref 6.0–8.5)

## 2019-06-10 LAB — CBC WITH DIFFERENTIAL/PLATELET
Basophils Absolute: 0 10*3/uL (ref 0.0–0.2)
Basos: 0 %
EOS (ABSOLUTE): 0 10*3/uL (ref 0.0–0.4)
Eos: 0 %
Hematocrit: 46.9 % — ABNORMAL HIGH (ref 34.0–46.6)
Hemoglobin: 15.6 g/dL (ref 11.1–15.9)
Immature Grans (Abs): 0 10*3/uL (ref 0.0–0.1)
Immature Granulocytes: 0 %
Lymphocytes Absolute: 0.7 10*3/uL (ref 0.7–3.1)
Lymphs: 32 %
MCH: 31.9 pg (ref 26.6–33.0)
MCHC: 33.3 g/dL (ref 31.5–35.7)
MCV: 96 fL (ref 79–97)
Monocytes Absolute: 0.5 10*3/uL (ref 0.1–0.9)
Monocytes: 21 %
Neutrophils Absolute: 1.1 10*3/uL — ABNORMAL LOW (ref 1.4–7.0)
Neutrophils: 47 %
Platelets: 121 10*3/uL — ABNORMAL LOW (ref 150–450)
RBC: 4.89 x10E6/uL (ref 3.77–5.28)
RDW: 11.6 % — ABNORMAL LOW (ref 11.7–15.4)
WBC: 2.3 10*3/uL — CL (ref 3.4–10.8)

## 2019-06-10 NOTE — Telephone Encounter (Signed)
[employer] calling in with an alert on a patient mis-routed.  States pt has a WBC of 2.3

## 2019-06-14 ENCOUNTER — Encounter: Payer: Self-pay | Admitting: Registered Nurse

## 2019-06-14 NOTE — Progress Notes (Signed)
Results letter sent to patient WBCs low LFTs high Follow up in 3 mos for repeat labs  Kathrin Ruddy NP

## 2019-06-16 ENCOUNTER — Ambulatory Visit: Payer: PRIVATE HEALTH INSURANCE

## 2019-11-24 ENCOUNTER — Encounter: Payer: Self-pay | Admitting: Registered Nurse

## 2020-05-05 ENCOUNTER — Encounter: Payer: Self-pay | Admitting: Registered Nurse

## 2020-05-05 NOTE — Telephone Encounter (Signed)
Called and LVM for patient to return the call to the office to schedule an appointment

## 2020-06-24 ENCOUNTER — Other Ambulatory Visit: Payer: Self-pay

## 2020-06-24 ENCOUNTER — Emergency Department (HOSPITAL_COMMUNITY)
Admission: EM | Admit: 2020-06-24 | Discharge: 2020-06-24 | Disposition: A | Discharge status: Home / Self Care | Payer: PRIVATE HEALTH INSURANCE | Attending: Emergency Medicine | Admitting: Emergency Medicine

## 2020-06-24 DIAGNOSIS — F10920 Alcohol use, unspecified with intoxication, uncomplicated: Secondary | ICD-10-CM

## 2020-06-24 DIAGNOSIS — R569 Unspecified convulsions: Secondary | ICD-10-CM | POA: Insufficient documentation

## 2020-06-24 DIAGNOSIS — F17210 Nicotine dependence, cigarettes, uncomplicated: Secondary | ICD-10-CM | POA: Insufficient documentation

## 2020-06-24 DIAGNOSIS — W19XXXA Unspecified fall, initial encounter: Secondary | ICD-10-CM

## 2020-06-24 NOTE — Discharge Instructions (Addendum)
Follow-up with primary doctor, and return to the ER if you experience any new and/or concerning symptoms.
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
## 2020-08-07 ENCOUNTER — Emergency Department (HOSPITAL_COMMUNITY)
Admission: EM | Admit: 2020-08-07 | Discharge: 2020-08-07 | Disposition: A | Discharge status: Home / Self Care | Payer: No Typology Code available for payment source | Attending: Emergency Medicine | Admitting: Emergency Medicine

## 2020-08-07 ENCOUNTER — Emergency Department (HOSPITAL_COMMUNITY): Payer: No Typology Code available for payment source

## 2020-08-07 ENCOUNTER — Encounter (HOSPITAL_COMMUNITY): Payer: Self-pay

## 2020-08-07 ENCOUNTER — Other Ambulatory Visit: Payer: Self-pay

## 2020-08-07 DIAGNOSIS — M79605 Pain in left leg: Secondary | ICD-10-CM | POA: Diagnosis present

## 2020-08-07 DIAGNOSIS — Y9301 Activity, walking, marching and hiking: Secondary | ICD-10-CM | POA: Diagnosis not present

## 2020-08-07 DIAGNOSIS — M25562 Pain in left knee: Secondary | ICD-10-CM | POA: Insufficient documentation

## 2020-08-07 DIAGNOSIS — Y92410 Unspecified street and highway as the place of occurrence of the external cause: Secondary | ICD-10-CM | POA: Diagnosis not present

## 2020-08-07 DIAGNOSIS — F17210 Nicotine dependence, cigarettes, uncomplicated: Secondary | ICD-10-CM | POA: Insufficient documentation

## 2020-08-07 MED ORDER — PREDNISONE 20 MG PO TABS: 40.0000 mg | ORAL_TABLET | Freq: Every day | ORAL | 0 refills | Status: AC

## 2020-08-07 MED ORDER — CEPHALEXIN 500 MG PO CAPS
500.0000 mg | ORAL_CAPSULE | Freq: Once | ORAL | Status: AC
  Administered 2020-08-07: 500 mg via ORAL
  Filled 2020-08-07: qty 1

## 2020-08-07 MED ORDER — DICLOFENAC SODIUM 1 % EX GEL: 2.0000 g | Freq: Four times a day (QID) | CUTANEOUS | 0 refills | Status: DC

## 2020-08-07 MED ORDER — CEPHALEXIN 500 MG PO CAPS: 500.0000 mg | ORAL_CAPSULE | Freq: Three times a day (TID) | ORAL | 0 refills | Status: AC

## 2020-08-07 MED ORDER — PREDNISONE 20 MG PO TABS
40.0000 mg | ORAL_TABLET | Freq: Once | ORAL | Status: AC
  Administered 2020-08-07: 40 mg via ORAL
  Filled 2020-08-07: qty 2
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
## 2020-08-07 NOTE — Discharge Instructions (Signed)
Continue resting, icing, and elevating your knee. Take prednisone as prescribed starting tomorrow for the next 4 days. Use antibiotics as prescribed to treat possible skin infection. Use voltaren gel as needed for further pain control. Follow-up with your primary care doctor as needed for further evaluation. Return to the emergency room with any new, worsening, concerning symptoms.
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
## 2021-08-01 ENCOUNTER — Encounter (HOSPITAL_COMMUNITY): Payer: Self-pay

## 2021-08-01 ENCOUNTER — Emergency Department (HOSPITAL_COMMUNITY)
Admission: EM | Admit: 2021-08-01 | Discharge: 2021-08-01 | Disposition: A | Discharge status: Home / Self Care | Payer: BC Managed Care – PPO | Attending: Emergency Medicine | Admitting: Emergency Medicine

## 2021-08-01 ENCOUNTER — Other Ambulatory Visit: Payer: Self-pay

## 2021-08-01 DIAGNOSIS — J101 Influenza due to other identified influenza virus with other respiratory manifestations: Secondary | ICD-10-CM | POA: Insufficient documentation

## 2021-08-01 DIAGNOSIS — F17210 Nicotine dependence, cigarettes, uncomplicated: Secondary | ICD-10-CM | POA: Diagnosis not present

## 2021-08-01 DIAGNOSIS — Z20822 Contact with and (suspected) exposure to covid-19: Secondary | ICD-10-CM | POA: Diagnosis not present

## 2021-08-01 DIAGNOSIS — R519 Headache, unspecified: Secondary | ICD-10-CM | POA: Diagnosis present

## 2021-08-01 LAB — BASIC METABOLIC PANEL
Anion gap: 13 (ref 5–15)
BUN: 8 mg/dL (ref 6–20)
CO2: 20 mmol/L — ABNORMAL LOW (ref 22–32)
Calcium: 8.6 mg/dL — ABNORMAL LOW (ref 8.9–10.3)
Chloride: 104 mmol/L (ref 98–111)
Creatinine, Ser: 0.45 mg/dL (ref 0.44–1.00)
GFR, Estimated: >60 mL/min (ref >60)
Glucose, Bld: 139 mg/dL — ABNORMAL HIGH (ref 70–99)
Potassium: 2.7 mmol/L — CL (ref 3.5–5.1)
Sodium: 137 mmol/L (ref 135–145)

## 2021-08-01 LAB — CBC WITH DIFFERENTIAL/PLATELET
Abs Immature Granulocytes: 0.02 10*3/uL (ref 0.00–0.07)
Basophils Absolute: 0 10*3/uL (ref 0.0–0.1)
Basophils Relative: 0 %
Eosinophils Absolute: 0 10*3/uL (ref 0.0–0.5)
Eosinophils Relative: 1 %
HCT: 39.5 % (ref 36.0–46.0)
Hemoglobin: 13.5 g/dL (ref 12.0–15.0)
Immature Granulocytes: 1 %
Lymphocytes Relative: 18 %
Lymphs Abs: 0.8 10*3/uL (ref 0.7–4.0)
MCH: 33.6 pg (ref 26.0–34.0)
MCHC: 34.2 g/dL (ref 30.0–36.0)
MCV: 98.3 fL (ref 80.0–100.0)
Monocytes Absolute: 0.4 10*3/uL (ref 0.1–1.0)
Monocytes Relative: 10 %
Neutro Abs: 3 10*3/uL (ref 1.7–7.7)
Neutrophils Relative %: 70 %
Platelets: 142 10*3/uL — ABNORMAL LOW (ref 150–400)
RBC: 4.02 MIL/uL (ref 3.87–5.11)
RDW: 11.8 % (ref 11.5–15.5)
WBC: 4.2 10*3/uL (ref 4.0–10.5)
nRBC: 0 % (ref 0.0–0.2)

## 2021-08-01 LAB — URINALYSIS, ROUTINE W REFLEX MICROSCOPIC
Bilirubin Urine: NEGATIVE
Glucose, UA: NEGATIVE mg/dL
Ketones, ur: 15 mg/dL — AB
Nitrite: NEGATIVE
Specific Gravity, Urine: 1.02 (ref 1.005–1.030)
pH: 6 (ref 5.0–8.0)

## 2021-08-01 LAB — RESP PANEL BY RT-PCR (FLU A&B, COVID) ARPGX2
Influenza A by PCR: POSITIVE — AB
Influenza B by PCR: NEGATIVE
SARS Coronavirus 2 by RT PCR: NEGATIVE

## 2021-08-01 LAB — URINALYSIS, MICROSCOPIC (REFLEX)

## 2021-08-01 MED ORDER — POTASSIUM CHLORIDE CRYS ER 20 MEQ PO TBCR: 20.0000 meq | EXTENDED_RELEASE_TABLET | Freq: Every day | ORAL | 0 refills | Status: AC

## 2021-08-01 MED ORDER — POTASSIUM CHLORIDE CRYS ER 20 MEQ PO TBCR
20.0000 meq | EXTENDED_RELEASE_TABLET | Freq: Once | ORAL | Status: AC
  Administered 2021-08-01: 20 meq via ORAL
  Filled 2021-08-01: qty 1

## 2021-08-01 MED ORDER — ACETAMINOPHEN 325 MG PO TABS
650.0000 mg | ORAL_TABLET | Freq: Once | ORAL | Status: AC
  Administered 2021-08-01: 650 mg via ORAL
  Filled 2021-08-01: qty 2

## 2021-08-01 MED ORDER — POTASSIUM CHLORIDE 10 MEQ/100ML IV SOLN
10.0000 meq | INTRAVENOUS | Status: AC
  Administered 2021-08-01 (×2): 10 meq via INTRAVENOUS
  Filled 2021-08-01 (×2): qty 100

## 2021-08-01 NOTE — ED Provider Notes (Signed)
[facility]-EMERGENCY DEPT Provider Note   CSN: [id number] Arrival date & time: 08/01/21  1222     History Chief Complaint  Patient presents with   Headache   Nasal Congestion    Misty James is a 40 y.o. female who presents with 3 days of congestion, headache, generalized fatigue, and body aches.  Intermittent fevers.  History of lupus but not on any medications every day.  Did not get vaccinated for influenza this year  I personally reviewed this patient medical records.  She has history of lupus without daily medications.  HPI      Past Medical History:  Diagnosis Date   Lupus (HCC)    Psoriasis    rheumatoid     There are no problems to display for this patient.   History reviewed. No pertinent surgical history.   OB History   No obstetric history on file.     Family History  Problem Relation Age of Onset   Hypertension Mother    Neuropathy Mother     Social History   Tobacco Use   Smoking status: Every Day    Packs/day: 0.50    Types: Cigarettes   Smokeless tobacco: Never  Vaping Use   Vaping Use: Never used  Substance Use Topics   Alcohol use: Yes    Comment: ocassionally   Drug use: No    Home Medications Prior to Admission medications   Medication Sig Start Date End Date Taking? Authorizing Provider  acetaminophen (TYLENOL) 500 MG tablet Take 1,000 mg by mouth every 6 (six) hours as needed for moderate pain or mild pain.   Yes [provider]  ASPIRIN 81 PO Take 1 tablet by mouth daily as needed (pain).   Yes [provider]  ibuprofen (ADVIL) 400 MG tablet Take 400 mg by mouth every 6 (six) hours as needed for mild pain or moderate pain.   Yes [provider]  MELATONIN PO Take 1 tablet by mouth at bedtime as needed (insomnia).   Yes [provider]  Soft Lens Products (VISINE FOR CONTACTS) SOLN Apply 1-2 drops to eye daily as needed (dryness).   Yes [provider]     Allergies    Patient has no known allergies.  Review of Systems   Review of Systems  Constitutional:  Positive for activity change, appetite change, chills, fatigue and fever.  HENT:  Positive for congestion and rhinorrhea. Negative for sore throat, trouble swallowing and voice change.   Eyes: Negative.   Respiratory:  Positive for cough. Negative for choking, chest tightness, shortness of breath, wheezing and stridor.   Cardiovascular: Negative.   Gastrointestinal: Negative.   Musculoskeletal:  Positive for myalgias.  Skin: Negative.   Neurological:  Positive for headaches. Negative for dizziness, tremors, syncope, facial asymmetry, weakness and light-headedness.   Physical Exam Updated Vital Signs BP (!) 145/98   Pulse 89   Temp 100 F (37.8 C) (Oral)   Resp 17   Ht 5\' 2"  (1.575 m)   SpO2 95%   BMI 29.26 kg/m   Physical Exam Vitals and nursing note reviewed.  Constitutional:      Appearance: She is not ill-appearing or toxic-appearing.  HENT:     Head: Normocephalic and atraumatic.     Nose: Congestion present. No rhinorrhea.     Mouth/Throat:     Mouth: Mucous membranes are moist.     Pharynx: Oropharynx is clear. Uvula midline. No oropharyngeal exudate or posterior oropharyngeal erythema.  Tonsils: No tonsillar exudate.  Eyes:     General: Lids are normal. Vision grossly intact.        Right eye: No discharge.        Left eye: No discharge.     Extraocular Movements: Extraocular movements intact.     Conjunctiva/sclera: Conjunctivae normal.     Pupils: Pupils are equal, round, and reactive to light.  Neck:     Trachea: Trachea and phonation normal.     Meningeal: Brudzinski's sign absent.  Cardiovascular:     Rate and Rhythm: Normal rate and regular rhythm.     Pulses: Normal pulses.     Heart sounds: Normal heart sounds. No murmur heard. Pulmonary:     Effort: Pulmonary effort is normal. No tachypnea, bradypnea, accessory muscle usage, prolonged  expiration or respiratory distress.     Breath sounds: Normal breath sounds. No wheezing or rales.  Abdominal:     General: Bowel sounds are normal. There is no distension.     Palpations: Abdomen is soft.     Tenderness: There is no abdominal tenderness. There is no right CVA tenderness, guarding or rebound.  Musculoskeletal:        General: No deformity.     Cervical back: Normal range of motion and neck supple. No edema, rigidity, tenderness or crepitus. No pain with movement, spinous process tenderness or muscular tenderness.     Right lower leg: No edema.     Left lower leg: No edema.  Lymphadenopathy:     Cervical: No cervical adenopathy.  Skin:    General: Skin is warm and dry.     Capillary Refill: Capillary refill takes less than 2 seconds.       Neurological:     General: No focal deficit present.     Mental Status: She is alert and oriented to person, place, and time. Mental status is at baseline.     GCS: GCS eye subscore is 4. GCS verbal subscore is 5. GCS motor subscore is 6.     Gait: Gait is intact.  Psychiatric:        Mood and Affect: Mood normal.    ED Results / Procedures / Treatments   Labs (all labs ordered are listed, but only abnormal results are displayed) Labs Reviewed  RESP PANEL BY RT-PCR (FLU A&B, COVID) ARPGX2 - Abnormal; Notable for the following components:      Result Value   Influenza A by PCR POSITIVE (*)    All other components within normal limits  BASIC METABOLIC PANEL - Abnormal; Notable for the following components:   Potassium 2.7 (*)    CO2 20 (*)    Glucose, Bld 139 (*)    Calcium 8.6 (*)    All other components within normal limits  CBC WITH DIFFERENTIAL/PLATELET - Abnormal; Notable for the following components:   Platelets 142 (*)    All other components within normal limits  URINALYSIS, ROUTINE W REFLEX MICROSCOPIC    EKG None  Radiology No results found.  Procedures Procedures   Medications Ordered in  ED Medications  potassium chloride 10 mEq in 100 mL IVPB (10 mEq Intravenous New Bag/Given 08/01/21 1812)  acetaminophen (TYLENOL) tablet 650 mg (650 mg Oral Given 08/01/21 1254)  potassium chloride SA (KLOR-CON M) CR tablet 20 mEq (20 mEq Oral Given 08/01/21 1808)    ED Course  I have reviewed the triage vital signs and the nursing notes.  Pertinent labs & imaging results that were available  during my care of the patient were reviewed by me and considered in my medical decision making (see chart for details).    MDM Rules/Calculators/A&P                         40 year old female presents with concern for 3 days of generalized body aches, cold like symptoms, and cough.  Differential diagnosis broad includes resolving to acute viral URI such as COVID-19 or influenza, pneumonia, PE, pleural effusion, CHF, sepsis.  Febrile to 100.5 F oral intake, tachycardic, and hypertensive.  Vital signs otherwise normal.  Cardiopulmonary exam is normal, abdominal exam is benign.  Patient with nasal congestion and skin changes to the dorsum of both hands consistent with her baseline lupus.  She is neurovascular tact in all 4 extremities.  Laboratory studies obtained from triage.  CBC is unremarkable, BMP with hypokalemia of 2.7.  This will be repleted orally and via IV.  EKG without QT prolongation or dysrhythmia.  Patient evaluated well-appearing.  May be discharged after completion of her IV potassium repletion.  Will need oral repletion at home for the next few days and outpatient cardiology follow-up for intermittent palpitations experienced at home.  Patient reevaluated, ambulating well. Feels ready to be discharged home.   Shon Hale to voiced understanding of her medical evaluation and treatment plan. Each of her questions was answered to her expressed satisfaction.  Return precautions were given.  Patient is well-appearing, stable, appropriate for discharge at this time.  This chart was dictated using  voice recognition software, Dragon. Despite the best efforts of this provider to proofread and correct errors, errors may still occur which can change documentation meaning.  Final Clinical Impression(s) / ED Diagnoses Final diagnoses:  None    Rx / DC Orders ED Discharge Orders     None        Sherrilee Gilles 08/01/21 2110    Vanetta Mulders, MD 08/02/21 2303

## 2021-08-01 NOTE — ED Provider Notes (Signed)
Emergency Medicine Provider Triage Evaluation Note  Misty James , a 40 y.o. female  was evaluated in triage.  Pt complains of not feeling well for the past few days.  History of lupus  Review of Systems  Positive: Head heaviness, dizziness, fatigue Negative: Chest pain or shortness of breath  Physical Exam  BP (!) 143/96 (BP Location: Left Arm)   Pulse (!) 125   Temp (!) 100.5 F (38.1 C) (Oral)   Resp 18   Ht 5\' 2"  (1.575 m)   SpO2 98%   BMI 29.26 kg/m  Gen:   Awake, no distress   Resp:  Normal effort  MSK:   Moves extremities without difficulty  Other:  Tachycardic, regular rhythm.  Lung sounds clear.  Medical Decision Making  Medically screening exam initiated at 12:41 PM.  Appropriate orders placed.  TAMEISHA COVELL was informed that the remainder of the evaluation will be completed by another provider, this initial triage assessment does not replace that evaluation, and the importance of remaining in the ED until their evaluation is complete.    Tachycardic in triage to 120s.  Very anxious.  Slightly febrile.  Potentially flu, EKG obtained.  Low risk for PE   Nancy Marus, PA-C 08/01/21 1243    08/03/21, MD 08/01/21 1654

## 2021-08-01 NOTE — ED Triage Notes (Signed)
Pt reports feeling unwell over the past few days. She endorses congestion, intermittent headache, and fatigue. Hx of lupus.

## 2021-08-01 NOTE — Discharge Instructions (Signed)
You are seen here today for your symptoms.  You were diagnosed with influenza A.  You may continue to take Tylenol and ibuprofen as needed for your fever and your discomfort.  Additionally you are potassium was quite low.  This was replaced via IV and by mouth in the ER today.  You have been prescribed potassium to take at home for the next few days.  Please follow-up with a primary care doctor.  Additionally below with a confirmation for the cardiologist with whom you may follow-up for your palpitations.  Return to the ER if you develop any chest pain, difficulty breathing, nausea or vomiting that does not stop, or any other new severe symptoms.

## 2021-08-15 DIAGNOSIS — R002 Palpitations: Secondary | ICD-10-CM | POA: Insufficient documentation

## 2021-08-15 NOTE — Progress Notes (Deleted)
° ° °  Patient referred by No ref. provider found for palpitations  Subjective:   Misty James, female    DOB: [birth year], 40 y.o.   MRN: [id number]  *** No chief complaint on file.   *** HPI  40 y.o. *** female with ***  *** Past Medical History:  Diagnosis Date   Lupus ([facility])    Psoriasis    rheumatoid     *** No past surgical history on file.  *** Social History   Tobacco Use  Smoking Status Every Day   Packs/day: 0.50   Types: Cigarettes  Smokeless Tobacco Never    Social History   Substance and Sexual Activity  Alcohol Use Yes   Comment: ocassionally    *** Family History  Problem Relation Age of Onset   Hypertension Mother    Neuropathy Mother     *** Current Outpatient Medications on File Prior to Visit  Medication Sig Dispense Refill   acetaminophen (TYLENOL) 500 MG tablet Take 1,000 mg by mouth every 6 (six) hours as needed for moderate pain or mild pain.     ASPIRIN 81 PO Take 1 tablet by mouth daily as needed (pain).     ibuprofen (ADVIL) 400 MG tablet Take 400 mg by mouth every 6 (six) hours as needed for mild pain or moderate pain.     MELATONIN PO Take 1 tablet by mouth at bedtime as needed (insomnia).     potassium chloride SA (KLOR-CON M) 20 MEQ tablet Take 1 tablet (20 mEq total) by mouth daily for 3 days. 3 tablet 0   Soft Lens Products (VISINE FOR CONTACTS) SOLN Apply 1-2 drops to eye daily as needed (dryness).     No current facility-administered medications on file prior to visit.    Cardiovascular and other pertinent studies:  *** EKG ***/***/202***: ***  EKG 08/02/2021; Sinus tachycardia 122 bpm Nonspecific T wave abnormality   Recent labs: 08/01/2021: Glucose 139, BUN/Cr 8/0.45. EGFR >60. Na/K 137/2.7.  H/H 13/39. MCV 98. Platelets 142  *** ROS      *** There were no vitals filed for this visit.   There is no height or weight on file to calculate BMI. There were no vitals filed for this  visit.  *** Objective:   Physical Exam    ***     Assessment & Recommendations:   ***  ***  Thank you for referring the patient to [place]. Please feel free to contact with any questions.   Nigel Mormon, MD Pager: [phone] Office: [phone]

## 2021-08-16 ENCOUNTER — Ambulatory Visit: Payer: BC Managed Care – PPO | Admitting: Cardiology

## 2021-08-16 DIAGNOSIS — R002 Palpitations: Secondary | ICD-10-CM

## 2022-04-24 ENCOUNTER — Ambulatory Visit: Payer: BC Managed Care – PPO | Admitting: Orthopaedic Surgery

## 2022-04-30 ENCOUNTER — Ambulatory Visit (INDEPENDENT_AMBULATORY_CARE_PROVIDER_SITE_OTHER): Payer: No Typology Code available for payment source | Admitting: Orthopaedic Surgery

## 2022-04-30 ENCOUNTER — Ambulatory Visit (INDEPENDENT_AMBULATORY_CARE_PROVIDER_SITE_OTHER): Payer: No Typology Code available for payment source

## 2022-04-30 ENCOUNTER — Encounter: Payer: Self-pay | Admitting: Orthopaedic Surgery

## 2022-04-30 DIAGNOSIS — M25562 Pain in left knee: Secondary | ICD-10-CM

## 2022-04-30 DIAGNOSIS — G8929 Other chronic pain: Secondary | ICD-10-CM
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
## 2022-08-19 ENCOUNTER — Encounter: Payer: Self-pay | Admitting: Orthopaedic Surgery

## 2022-12-25 ENCOUNTER — Other Ambulatory Visit: Payer: Self-pay

## 2022-12-25 ENCOUNTER — Emergency Department (HOSPITAL_COMMUNITY)
Admission: EM | Admit: 2022-12-25 | Discharge: 2022-12-26 | Disposition: A | Discharge status: Home / Self Care | Payer: No Typology Code available for payment source | Attending: Student | Admitting: Student

## 2022-12-25 ENCOUNTER — Encounter (HOSPITAL_COMMUNITY): Payer: Self-pay

## 2022-12-25 DIAGNOSIS — F17210 Nicotine dependence, cigarettes, uncomplicated: Secondary | ICD-10-CM | POA: Insufficient documentation

## 2022-12-25 DIAGNOSIS — F410 Panic disorder [episodic paroxysmal anxiety] without agoraphobia: Secondary | ICD-10-CM | POA: Insufficient documentation

## 2022-12-25 DIAGNOSIS — D72819 Decreased white blood cell count, unspecified: Secondary | ICD-10-CM | POA: Diagnosis not present

## 2022-12-25 DIAGNOSIS — Y908 Blood alcohol level of 240 mg/100 ml or more: Secondary | ICD-10-CM | POA: Insufficient documentation

## 2022-12-25 DIAGNOSIS — E876 Hypokalemia: Secondary | ICD-10-CM | POA: Diagnosis not present

## 2022-12-25 DIAGNOSIS — Z7982 Long term (current) use of aspirin: Secondary | ICD-10-CM | POA: Diagnosis not present

## 2022-12-25 DIAGNOSIS — F10129 Alcohol abuse with intoxication, unspecified: Secondary | ICD-10-CM | POA: Insufficient documentation

## 2022-12-25 DIAGNOSIS — R519 Headache, unspecified: Secondary | ICD-10-CM | POA: Diagnosis not present

## 2022-12-25 DIAGNOSIS — R002 Palpitations: Secondary | ICD-10-CM | POA: Diagnosis present

## 2022-12-25 DIAGNOSIS — F10920 Alcohol use, unspecified with intoxication, uncomplicated: Secondary | ICD-10-CM

## 2022-12-25 LAB — CBC WITH DIFFERENTIAL/PLATELET

## 2022-12-25 MED ORDER — LORAZEPAM 0.5 MG PO TABS
0.5000 mg | ORAL_TABLET | Freq: Once | ORAL | Status: AC
  Administered 2022-12-25: 0.5 mg via ORAL
  Filled 2022-12-25: qty 1

## 2022-12-25 NOTE — ED Triage Notes (Signed)
BIB EMS from home for anxiety attack. Pt told EMS she does not have anxiety and she has "cancer in her head", has "lupus but takes no medications for it". Pt has had a lot of recent deaths and stress in her family.  Had 1 shot of liquor tonight, denies drug use.

## 2022-12-26 LAB — CBC WITH DIFFERENTIAL/PLATELET
Abs Immature Granulocytes: 0.01 10*3/uL (ref 0.00–0.07)
Basophils Absolute: 0 10*3/uL (ref 0.0–0.1)
Basophils Relative: 0 %
Eosinophils Absolute: 0 10*3/uL (ref 0.0–0.5)
Eosinophils Relative: 0 %
HCT: 43 % (ref 36.0–46.0)
Hemoglobin: 14.4 g/dL (ref 12.0–15.0)
Immature Granulocytes: 0 %
Lymphocytes Relative: 30 %
Lymphs Abs: 1.2 10*3/uL (ref 0.7–4.0)
MCH: 33.3 pg (ref 26.0–34.0)
MCHC: 33.5 g/dL (ref 30.0–36.0)
MCV: 99.5 fL (ref 80.0–100.0)
Monocytes Absolute: 0.6 10*3/uL (ref 0.1–1.0)
Monocytes Relative: 16 %
Neutro Abs: 2 10*3/uL (ref 1.7–7.7)
Neutrophils Relative %: 54 %
Platelets: 158 10*3/uL (ref 150–400)
RBC: 4.32 MIL/uL (ref 3.87–5.11)
RDW: 13.8 % (ref 11.5–15.5)
WBC: 3.8 10*3/uL — ABNORMAL LOW (ref 4.0–10.5)
nRBC: 0.5 % — ABNORMAL HIGH (ref 0.0–0.2)

## 2022-12-26 LAB — COMPREHENSIVE METABOLIC PANEL
ALT: 70 U/L — ABNORMAL HIGH (ref 0–44)
AST: 201 U/L — ABNORMAL HIGH (ref 15–41)
Albumin: 3.7 g/dL (ref 3.5–5.0)
Alkaline Phosphatase: 310 U/L — ABNORMAL HIGH (ref 38–126)
Anion gap: 12 (ref 5–15)
BUN: <5 mg/dL — ABNORMAL LOW (ref 6–20)
CO2: 25 mmol/L (ref 22–32)
Calcium: 8.2 mg/dL — ABNORMAL LOW (ref 8.9–10.3)
Chloride: 105 mmol/L (ref 98–111)
Creatinine, Ser: 0.55 mg/dL (ref 0.44–1.00)
GFR, Estimated: >60 mL/min (ref >60)
Glucose, Bld: 95 mg/dL (ref 70–99)
Potassium: 2.9 mmol/L — ABNORMAL LOW (ref 3.5–5.1)
Sodium: 142 mmol/L (ref 135–145)
Total Bilirubin: 1 mg/dL (ref 0.3–1.2)
Total Protein: 8.5 g/dL — ABNORMAL HIGH (ref 6.5–8.1)

## 2022-12-26 LAB — ETHANOL: Alcohol, Ethyl (B): 318 mg/dL (ref <10)

## 2022-12-26 LAB — TROPONIN I (HIGH SENSITIVITY): Troponin I (High Sensitivity): 9 ng/L (ref <18)

## 2022-12-26 MED ORDER — POTASSIUM CHLORIDE CRYS ER 20 MEQ PO TBCR
40.0000 meq | EXTENDED_RELEASE_TABLET | Freq: Once | ORAL | Status: AC
  Administered 2022-12-26: 40 meq via ORAL
  Filled 2022-12-26: qty 2

## 2022-12-26 MED ORDER — HYDROXYZINE HCL 25 MG PO TABS: 25.0000 mg | ORAL_TABLET | Freq: Four times a day (QID) | ORAL | 0 refills | Status: AC | PRN

## 2022-12-26 MED ORDER — MAGNESIUM OXIDE -MG SUPPLEMENT 400 (240 MG) MG PO TABS
800.0000 mg | ORAL_TABLET | Freq: Once | ORAL | Status: AC
  Administered 2022-12-26: 800 mg via ORAL
  Filled 2022-12-26: qty 2
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
## 2024-02-25 ENCOUNTER — Emergency Department (HOSPITAL_COMMUNITY)

## 2024-02-25 ENCOUNTER — Emergency Department (HOSPITAL_COMMUNITY)
Admission: EM | Admit: 2024-02-25 | Discharge: 2024-02-25 | Disposition: A | Discharge status: Home / Self Care | Attending: Emergency Medicine | Admitting: Emergency Medicine

## 2024-02-25 ENCOUNTER — Other Ambulatory Visit: Payer: Self-pay

## 2024-02-25 DIAGNOSIS — F419 Anxiety disorder, unspecified: Secondary | ICD-10-CM | POA: Diagnosis not present

## 2024-02-25 DIAGNOSIS — M25561 Pain in right knee: Secondary | ICD-10-CM | POA: Insufficient documentation

## 2024-02-25 DIAGNOSIS — Z7982 Long term (current) use of aspirin: Secondary | ICD-10-CM | POA: Insufficient documentation

## 2024-02-25 MED ORDER — HYDROXYZINE HCL 25 MG PO TABS
25.0000 mg | ORAL_TABLET | Freq: Once | ORAL | Status: AC
  Administered 2024-02-25: 25 mg via ORAL
  Filled 2024-02-25: qty 1

## 2024-02-25 MED ORDER — HYDROXYZINE HCL 25 MG PO TABS: 25.0000 mg | ORAL_TABLET | Freq: Four times a day (QID) | ORAL | 0 refills | Status: AC

## 2024-02-25 NOTE — ED Triage Notes (Signed)
 Pt says that her right knee was shaking and like it was going to give out while she was taking a walking tour of an apartment today. She says her knee normally hurts around a 6/10, but today it went to around a 10. She has a hx of lupus and she has not had her lupus medications. She says she just does not feel good and she has bad anxiety. Last ETOH around 1600, drinks about 2-3 shots a day.

## 2024-02-25 NOTE — ED Provider Notes (Signed)
 [place] EMERGENCY DEPARTMENT AT [facility] Provider Note   CSN: [id number] Arrival date & time: 02/25/24  8082     Patient presents with: Knee Pain and Anxiety   Misty James is a 43 y.o. female past medical history significant for lupus and chronic left knee pain presents today for right knee pain.  Patient states that her knee was shaking and felt like it was going to give out while she was taking a walking tour of an apartment today.  Patient states that she has not been taking her lupus medications.  Patient reports anxiety as well.  Patient denies any injury, swelling, erythema, or wound.  Patient denies HI, SI, or AVH at this time.    Knee Pain Anxiety       Prior to Admission medications   Medication Sig Start Date End Date Taking? Authorizing Provider  hydrOXYzine  (ATARAX ) 25 MG tablet Take 1 tablet (25 mg total) by mouth every 6 (six) hours for 10 days. 02/25/24 03/06/24 Yes Trezure Cronk N, PA-C  acetaminophen  (TYLENOL ) 500 MG tablet Take 1,000 mg by mouth every 6 (six) hours as needed for moderate pain or mild pain.    [provider]  ASPIRIN 81 PO Take 1 tablet by mouth daily as needed (pain).    [provider]  hydrOXYzine  (ATARAX ) 25 MG tablet Take 1 tablet (25 mg total) by mouth every 6 (six) hours as needed for anxiety. 12/26/22   Kommor, [place], MD  ibuprofen  (ADVIL ) 400 MG tablet Take 400 mg by mouth every 6 (six) hours as needed for mild pain or moderate pain.    [provider]  MELATONIN PO Take 1 tablet by mouth at bedtime as needed (insomnia).    [provider]  potassium chloride  SA (KLOR-CON  M) 20 MEQ tablet Take 1 tablet (20 mEq total) by mouth daily for 3 days. 08/01/21 08/04/21  Sponseller, Pleasant SAUNDERS, PA-C  Soft Lens Products (VISINE FOR CONTACTS) SOLN Apply 1-2 drops to eye daily as needed (dryness).    [provider]    Allergies: Patient has no known allergies.    Review of Systems   Musculoskeletal:  Positive for arthralgias.  Psychiatric/Behavioral:  The patient is nervous/anxious.     Updated Vital Signs BP 120/77 (BP Location: Right Arm)   Pulse (!) 102   Temp 98.2 F (36.8 C) (Oral)   Resp 20   SpO2 100%   Physical Exam Vitals and nursing note reviewed.  Constitutional:      General: She is not in acute distress.    Appearance: Normal appearance. She is well-developed. She is not ill-appearing.  HENT:     Head: Normocephalic and atraumatic.     Mouth/Throat:     Mouth: Mucous membranes are moist.   Eyes:     Conjunctiva/sclera: Conjunctivae normal.    Cardiovascular:     Rate and Rhythm: Normal rate and regular rhythm.     Pulses: Normal pulses.     Heart sounds: Normal heart sounds.  Pulmonary:     Effort: Pulmonary effort is normal. No respiratory distress.  Abdominal:     Palpations: Abdomen is soft.     Tenderness: There is no abdominal tenderness.   Musculoskeletal:        General: Swelling present. No tenderness or deformity.     Cervical back: Normal range of motion and neck supple.     Right lower leg: No edema.     Left lower leg: No  edema.     Comments: Patient has mild swelling to her right anterior knee when compared to left.  No warmth, erythema, ecchymosis, or laxity noted on exam.  Patient is neurovascularly intact.   Skin:    General: Skin is warm and dry.     Capillary Refill: Capillary refill takes less than 2 seconds.   Neurological:     General: No focal deficit present.     Mental Status: She is alert and oriented to person, place, and time.   Psychiatric:        Mood and Affect: Mood is anxious.     (all labs ordered are listed, but only abnormal results are displayed) Labs Reviewed - No data to display  EKG: None  Radiology: DG Knee Complete 4 Views Right Result Date: 02/25/2024 CLINICAL DATA:  Pain with difficulty ambulating. EXAM: RIGHT KNEE - COMPLETE 4+ VIEW COMPARISON:  None Available. FINDINGS: No  evidence of fracture, dislocation, or joint effusion. No evidence of arthropathy or other focal bone abnormality. Soft tissues are unremarkable. IMPRESSION: Negative. Electronically Signed   By: Greig Pique M.D.   On: 02/25/2024 20:21     Procedures   Medications Ordered in the ED  hydrOXYzine  (ATARAX ) tablet 25 mg (25 mg Oral Given 02/25/24 2043)                                    Medical Decision Making Risk Prescription drug management.   This patient presents to the ED for concern of right knee pain differential diagnosis includes fracture, dislocation, musculoskeletal pain  Imaging Studies ordered:  I ordered imaging studies including right knee x-ray I independently visualized and interpreted imaging which showed negative I agree with the radiologist interpretation   Medicines ordered and prescription drug management:  I ordered medication including Atarax     I have reviewed the patients home medicines and have made adjustments as needed   Problem List / ED Course:  Patient placed in hinged knee brace Consider for admission or further workup however patient's vital signs, physical exam, and imaging of been reassuring.  Patient advised to take Tylenol  Motrin  as needed for pain.  Patient placed in knee brace for support.  Patient to follow-up with PCP if her symptoms persist for further evaluation workup.       Final diagnoses:  Acute pain of right knee  Anxiety    ED Discharge Orders          Ordered    hydrOXYzine  (ATARAX ) 25 MG tablet  Every 6 hours        02/25/24 2049               Francis Ileana SAILOR, PA-C 02/25/24 2050    Franklyn Sid SAILOR, MD 02/26/24 [phone]

## 2024-02-25 NOTE — Discharge Instructions (Addendum)
 Today you were seen for right knee pain and anxiety.  Please rest, ice, compress, and elevate the affected limb.  You may alternate taking Tylenol  and Motrin  as needed for pain and swelling.  Please follow-up with your PCP if your symptoms persist for further evaluation and workup.  Thank you for letting us  treat you today. After reviewing your imaging, I feel you are safe to go home. Please follow up with your PCP in the next several days and provide them with your records from this visit. Return to the Emergency Room if pain becomes severe or symptoms worsen.

## 2024-02-25 NOTE — ED Provider Triage Note (Signed)
 Emergency Medicine Provider Triage Evaluation Note  Misty James , a 43 y.o. female  was evaluated in triage.  Pt complains of right knee pain.  Patient has lupus and reports chronic knee pain, however knee pain is worse today.  She complains of pain to lateral/anterior aspect of right knee, pain is worse at night, she was having trouble ambulating today secondary to this pain.  No known injury/inciting event.  Review of Systems  Positive: As above Negative: As above  Physical Exam  BP 120/77 (BP Location: Right Arm)   Pulse (!) 102   Temp 98.2 F (36.8 C) (Oral)   Resp 20   SpO2 100%  Gen:   Awake, no distress   Resp:  Normal effort  MSK:   Moves extremities without difficulty  Other:  Full range of motion of right knee, no obvious swelling/effusion/deformity, psoriasis scarring over anterior aspect of knee  Medical Decision Making  Medically screening exam initiated at 7:55 PM.  Appropriate orders placed.  JALEENA VIVIANI was informed that the remainder of the evaluation will be completed by another provider, this initial triage assessment does not replace that evaluation, and the importance of remaining in the ED until their evaluation is complete.     Glendia Rocky SAILOR, [place] 02/25/24 [phone]

## 2024-02-25 NOTE — Progress Notes (Signed)
 Orthopedic Tech Progress Note Patient Details:  Misty James June 13, 1981 [id number]  Ortho Devices Type of Ortho Device: Knee Sleeve Ortho Device/Splint Location: right hinged knee bracce applied Ortho Device/Splint Interventions: Ordered, Application, Adjustment   Post Interventions Patient Tolerated: Well Instructions Provided: Adjustment of device, Care of device  [name] 02/25/2024, 8:45 PM
